# Patient Record
Sex: Female | Born: 1975 | Race: White | Hispanic: No | Marital: Married | Smoking: Never smoker
Health system: Southern US, Community
[De-identification: ages and names within clinical notes are randomized; demographics above are authoritative.]

## PROBLEM LIST (undated history)

## (undated) DIAGNOSIS — Z1239 Encounter for other screening for malignant neoplasm of breast: Secondary | ICD-10-CM

## (undated) DIAGNOSIS — E781 Pure hyperglyceridemia: Secondary | ICD-10-CM

## (undated) DIAGNOSIS — L02219 Cutaneous abscess of trunk, unspecified: Secondary | ICD-10-CM

## (undated) DIAGNOSIS — F411 Generalized anxiety disorder: Secondary | ICD-10-CM

## (undated) DIAGNOSIS — T7840XA Allergy, unspecified, initial encounter: Secondary | ICD-10-CM

## (undated) DIAGNOSIS — Z9889 Other specified postprocedural states: Secondary | ICD-10-CM

## (undated) DIAGNOSIS — F329 Major depressive disorder, single episode, unspecified: Secondary | ICD-10-CM

## (undated) DIAGNOSIS — J302 Other seasonal allergic rhinitis: Secondary | ICD-10-CM

## (undated) DIAGNOSIS — F419 Anxiety disorder, unspecified: Secondary | ICD-10-CM

## (undated) DIAGNOSIS — L03319 Cellulitis of trunk, unspecified: Secondary | ICD-10-CM

## (undated) DIAGNOSIS — L259 Unspecified contact dermatitis, unspecified cause: Secondary | ICD-10-CM

## (undated) DIAGNOSIS — M1990 Unspecified osteoarthritis, unspecified site: Secondary | ICD-10-CM

## (undated) DIAGNOSIS — E785 Hyperlipidemia, unspecified: Secondary | ICD-10-CM

## (undated) DIAGNOSIS — D259 Leiomyoma of uterus, unspecified: Secondary | ICD-10-CM

## (undated) DIAGNOSIS — R112 Nausea with vomiting, unspecified: Secondary | ICD-10-CM

## (undated) DIAGNOSIS — Z124 Encounter for screening for malignant neoplasm of cervix: Secondary | ICD-10-CM

## (undated) DIAGNOSIS — K644 Residual hemorrhoidal skin tags: Secondary | ICD-10-CM

## (undated) HISTORY — DX: Encounter for screening for malignant neoplasm of cervix: Z12.4

## (undated) HISTORY — DX: Unspecified contact dermatitis, unspecified cause: L25.9

## (undated) HISTORY — DX: Encounter for other screening for malignant neoplasm of breast: Z12.39

## (undated) HISTORY — DX: Cutaneous abscess of trunk, unspecified: L02.219

## (undated) HISTORY — DX: Cellulitis of trunk, unspecified: L03.319

## (undated) HISTORY — DX: Unspecified osteoarthritis, unspecified site: M19.90

## (undated) HISTORY — DX: Allergy, unspecified, initial encounter: T78.40XA

## (undated) HISTORY — DX: Hyperlipidemia, unspecified: E78.5

## (undated) HISTORY — PX: HAND SURGERY: SHX662

## (undated) HISTORY — PX: WISDOM TOOTH EXTRACTION: SHX21

---

## 1994-12-14 HISTORY — PX: HAND SURGERY: SHX662

## 2008-12-14 HISTORY — PX: WISDOM TOOTH EXTRACTION: SHX21

## 2010-07-29 ENCOUNTER — Ambulatory Visit: Payer: Self-pay | Admitting: Otolaryngology

## 2010-10-24 LAB — HM PAP SMEAR: HM Pap smear: NORMAL

## 2011-07-30 ENCOUNTER — Encounter: Payer: Self-pay | Admitting: Internal Medicine

## 2011-08-10 ENCOUNTER — Ambulatory Visit: Payer: Self-pay | Admitting: Internal Medicine

## 2011-08-10 ENCOUNTER — Encounter: Payer: Self-pay | Admitting: Internal Medicine

## 2011-08-10 ENCOUNTER — Ambulatory Visit (INDEPENDENT_AMBULATORY_CARE_PROVIDER_SITE_OTHER): Payer: BC Managed Care – PPO | Admitting: Internal Medicine

## 2011-08-10 VITALS — BP 142/88 | HR 89 | Temp 98.5°F | Resp 20 | Ht 66.0 in | Wt 196.0 lb

## 2011-08-10 DIAGNOSIS — E781 Pure hyperglyceridemia: Secondary | ICD-10-CM | POA: Insufficient documentation

## 2011-08-10 DIAGNOSIS — Z820 Family history of epilepsy and other diseases of the nervous system: Secondary | ICD-10-CM

## 2011-08-10 DIAGNOSIS — L709 Acne, unspecified: Secondary | ICD-10-CM

## 2011-08-10 DIAGNOSIS — L708 Other acne: Secondary | ICD-10-CM

## 2011-08-10 DIAGNOSIS — Z0000 Encounter for general adult medical examination without abnormal findings: Secondary | ICD-10-CM

## 2011-08-10 MED ORDER — DOXYCYCLINE HYCLATE 50 MG PO CAPS: 50.0000 mg | ORAL_CAPSULE | Freq: Two times a day (BID) | ORAL | Status: AC

## 2011-08-10 NOTE — Patient Instructions (Addendum)
Follow up with neurology as instructed. Have lipid profile checked with labs this fall. Return to clinic as needed and in 1 year.  Hypertriglyceridemia  Diet for High blood levels of Triglycerides Most fats in food are triglycerides. Triglycerides in your blood are stored as fat in your body. High levels of triglycerides in your blood may put you at a greater risk for heart disease and stroke.  Normal triglyceride levels are less than 150 mg/dL. Borderline high levels are 150-199 mg/dl. High levels are 200 - 499 mg/dL, and very high triglyceride levels are greater than 500 mg/dL. The decision to treat high triglycerides is generally based on the level. For people with borderline or high triglyceride levels, treatment includes weight loss and exercise. Drugs are recommended for people with very high triglyceride levels. Many people who need treatment for high triglyceride levels have metabolic syndrome. This syndrome is a collection of disorders that often include: insulin resistance, high blood pressure, blood clotting problems, high cholesterol and triglycerides. TESTING PROCEDURE FOR TRIGLYCERIDES  You should not eat 4 hours before getting your triglycerides measured. The normal range of triglycerides is between 10 and 250 milligrams per deciliter (mg/dl). Some people may have extreme levels (1000 or above), but your triglyceride level may be too high if it is above 150 mg/dl, depending on what other risk factors you have for heart disease.   People with high blood triglycerides may also have high blood cholesterol levels. If you have high blood cholesterol as well as high blood triglycerides, your risk for heart disease is probably greater than if you only had high triglycerides. High blood cholesterol is one of the main risk factors for heart disease.  CHANGING YOUR DIET  Your weight can affect your blood triglyceride level. If you are more than 20% above your ideal body weight, you may be able to  lower your blood triglycerides by losing weight. Eating less and exercising regularly is the best way to combat this. Fat provides more calories than any other food. The best way to lose weight is to eat less fat. Only 30% of your total calories should come from fat. Less than 7% of your diet should come from saturated fat. A diet low in fat and saturated fat is the same as a diet to decrease blood cholesterol. By eating a diet lower in fat, you may lose weight, lower your blood cholesterol, and lower your blood triglyceride level.  Eating a diet low in fat, especially saturated fat, may also help you lower your blood triglyceride level. Ask your dietitian to help you figure how much fat you can eat based on the number of calories your caregiver has prescribed for you.  Exercise, in addition to helping with weight loss may also help lower triglyceride levels.   Alcohol can increase blood triglycerides. You may need to stop drinking alcoholic beverages.   Too much carbohydrate in your diet may also increase your blood triglycerides. Some complex carbohydrates are necessary in your diet. These may include bread, rice, potatoes, other starchy vegetables and cereals.   Reduce "simple" carbohydrates. These may include pure sugars, candy, honey, and jelly without losing other nutrients. If you have the kind of high blood triglycerides that is affected by the amount of carbohydrates in your diet, you will need to eat less sugar and less high-sugar foods. Your caregiver can help you with this.   Adding 2-4 grams of fish oil (EPA+ DHA) may also help lower triglycerides. Speak with your caregiver before  adding any supplements to your regimen.  Following the Diet  Maintain your ideal weight. Your caregivers can help you with a diet. Generally, eating less food and getting more exercise will help you lose weight. Joining a weight control group may also help. Ask your caregivers for a good weight control group in  your area.  Eat low-fat foods instead of high-fat foods. This can help you lose weight too.  These foods are lower in fat. Eat MORE of these:   Dried beans, peas, and lentils.   Egg whites.   Low-fat cottage cheese.   Fish.   Lean cuts of meat, such as round, sirloin, rump, and flank (cut extra fat off meat you fix).   Whole grain breads, cereals and pasta.   Skim and nonfat dry milk.   Low-fat yogurt.   Poultry without the skin.   Cheese made with skim or part-skim milk, such as mozzarella, parmesan, farmers', ricotta, or pot cheese.   These are higher fat foods. Eat LESS of these:   Whole milk and foods made from whole milk, such as American, blue, cheddar, monterey jack, and swiss cheese   High-fat meats, such as luncheon meats, sausages, knockwurst, bratwurst, hot dogs, ribs, corned beef, ground pork, and regular ground beef.   Fried foods.  Limit saturated fats in your diet. Substituting unsaturated fat for saturated fat may decrease your blood triglyceride level. You will need to read package labels to know which products contain saturated fats.  These foods are high in saturated fat. Eat LESS of these:   Fried pork skins.  Whole milk.   Skin and fat from poultry.   Palm oil.   Butter.   Shortening.   Cream cheese.   Tomasa Blase.   Margarines and baked goods made from listed oils.   Vegetable shortenings.   Chitterlings.  Fat from meats.   Coconut oil.   Palm kernel oil.   Lard.   Cream.   Sour cream.   Fatback.   Coffee whiteners and non-dairy creamers made with these oils.   Cheese made from whole milk.   Use unsaturated fats (both polyunsaturated and monounsaturated) moderately. Remember, even though unsaturated fats are better than saturated fats; you still want a diet low in total fat.  These foods are high in unsaturated fat:   Canola oil.  Sunflower oil.   Mayonnaise.   Almonds.   Peanuts.   Pine nuts.   Margarines made with  these oils.   Safflower oil.  Olive oil.   Avocados.   Cashews.   Peanut butter.   Sunflower seeds.   Soybean oil.  Peanut oil.   Olives.   Pecans.   Walnuts.   Pumpkin seeds.   Avoid sugar and other high-sugar foods. This will decrease carbohydrates without decreasing other nutrients. Sugar in your food goes rapidly to your blood. When there is excess sugar in your blood, your liver may use it to make more triglycerides. Sugar also contains calories without other important nutrients.  Eat LESS of these:   Sugar, brown sugar, powdered sugar, jam, jelly, preserves, honey, syrup, molasses, pies, candy, cakes, cookies, frosting, pastries, colas, soft drinks, punches, fruit drinks, and regular gelatin.   Avoid alcohol. Alcohol, even more than sugar, may increase blood triglycerides. In addition, alcohol is high in calories and low in nutrients. Ask for sparkling water, or a diet soft drink instead of an alcoholic beverage.  Suggestions for planning and preparing meals   Bake, broil, grill or  roast meats instead of frying.   Remove fat from meats and skin from poultry before cooking.   Add spices, herbs, lemon juice or vinegar to vegetables instead of salt, rich sauces or gravies.   Use a non-stick skillet without fat or use no-stick sprays.   Cool and refrigerate stews and broth. Then remove the hardened fat floating on the surface before serving.   Refrigerate meat drippings and skim off fat to make low-fat gravies.   Serve more fish.   Use less butter, margarine and other high-fat spreads on bread or vegetables.   Use skim or reconstituted non-fat dry milk for cooking.   Cook with low-fat cheeses.   Substitute low-fat yogurt or cottage cheese for all or part of the sour cream in recipes for sauces, dips or congealed salads.   Use half yogurt/half mayonnaise in salad recipes.   Substitute evaporated skim milk for cream. Evaporated skim milk or reconstituted non-fat  dry milk can be whipped and substituted for whipped cream in certain recipes.   Choose fresh fruits for dessert instead of high-fat foods such as pies or cakes. Fruits are naturally low in fat.  When Dining Out   Order low-fat appetizers such as fruit or vegetable juice, pasta with vegetables or tomato sauce.   Select clear, rather than cream soups.   Ask that dressings and gravies be served on the side. Then use less of them.   Order foods that are baked, broiled, poached, steamed, stir-fried, or roasted.   Ask for margarine instead of butter, and use only a small amount.   Drink sparkling water, unsweetened tea or coffee, or diet soft drinks instead of alcohol or other sweet beverages.  QUESTIONS AND ANSWERS ABOUT OTHER FATS IN THE BLOOD:  SATURATED FAT, TRANS FAT, AND CHOLESTEROL What is trans fat? Trans fat is a type of fat that is formed when vegetable oil is hardened through a process called hydrogenation. This process helps makes foods more solid, gives them shape, and prolongs their shelf life. Trans fats are also called hydrogenated or partially hydrogenated oils.  What do saturated fat, trans fat, and cholesterol in foods have to do with heart disease? Saturated fat, trans fat, and cholesterol in the diet all raise the level of LDL "bad" cholesterol in the blood. The higher the LDL cholesterol, the greater the risk for coronary heart disease (CHD). Saturated fat and trans fat raise LDL similarly.  What foods contain saturated fat, trans fat, and cholesterol? High amounts of saturated fat are found in animal products, such as fatty cuts of meat, chicken skin, and full-fat dairy products like butter, whole milk, cream, and cheese, and in tropical vegetable oils such as palm, palm kernel, and coconut oil. Trans fat is found in some of the same foods as saturated fat, such as vegetable shortening, some margarines (especially hard or stick margarine), crackers, cookies, baked goods, fried  foods, salad dressings, and other processed foods made with partially hydrogenated vegetable oils. Small amounts of trans fat also occur naturally in some animal products, such as milk products, beef, and lamb. Foods high in cholesterol include liver, other organ meats, egg yolks, shrimp, and full-fat dairy products. How can I use the new food label to make heart-healthy food choices? Check the Nutrition Facts panel of the food label. Choose foods lower in saturated fat, trans fat, and cholesterol. For saturated fat and cholesterol, you can also use the Percent Daily Value (%DV): 5% DV or less is low, and 20%  DV or more is high. (There is no %DV for trans fat.) Use the Nutrition Facts panel to choose foods low in saturated fat and cholesterol, and if the trans fat is not listed, read the ingredients and limit products that list shortening or hydrogenated or partially hydrogenated vegetable oil, which tend to be high in trans fat. POINTS TO REMEMBER: YOU NEED A LITTLE TLC (THERAPEUTIC LIFESTYLE CHANGES)  Discuss your risk for heart disease with your caregivers, and take steps to reduce risk factors.   Change your diet. Choose foods that are low in saturated fat, trans fat, and cholesterol.   Add exercise to your daily routine if it is not already being done. Participate in physical activity of moderate intensity, like brisk walking, for at least 30 minutes on most, and preferably all days of the week. No time? Break the 30 minutes into three, 10-minute segments during the day.   Stop smoking. If you do smoke, contact your caregiver to discuss ways in which they can help you quit.   Do not use street drugs.   Maintain a normal weight.   Maintain a healthy blood pressure.   Keep up with your blood work for checking the fats in your blood as directed by your caregiver.  Document Released: 09/17/2004 Document Re-Released: 05/20/2010 [facility] Patient Information 2011 [place], [place].

## 2011-08-10 NOTE — Progress Notes (Signed)
Subjective:    Patient ID: Alisha Wagner, female    DOB: 05/16/1976, 35 y.o.   MRN: [id number]  HPI  Alisha Wagner is a 35YO female who presents for her annual exam. She denies any complaints. She has been exercising several hours per week doing aerobics and lifting weights in effort to improve overall health. She has also been cutting out processed carbohydrates from her diet and increasing intake of fruits and vegetables.  She notes that her father passed away this past spring, and at the time of his death, revealed that he had myotonic dystrophy type 2.  She is interested in learning more as to her risk of this condition.  Outpatient Encounter Prescriptions as of 08/10/2011  Medication Sig Dispense Refill  . Cholecalciferol 1000 UNITS capsule Take 1,000 Units by mouth daily.        . fish oil-omega-3 fatty acids 1000 MG capsule Take 2 g by mouth daily.        . multivitamin (THERAGRAN) tablet Take 1 tablet by mouth daily.        Marland Kitchen doxycycline (VIBRAMYCIN) 50 MG capsule Take 1 capsule (50 mg total) by mouth 2 (two) times daily.  60 capsule  6     Review of Systems  Constitutional: Negative for fever, chills, diaphoresis, activity change, appetite change, fatigue and unexpected weight change.  HENT: Negative for hearing loss, ear pain, congestion, sore throat, rhinorrhea, sneezing, trouble swallowing, neck pain, neck stiffness, voice change, postnasal drip, sinus pressure and tinnitus.   Eyes: Negative for photophobia, pain, redness and visual disturbance.  Respiratory: Negative for apnea, cough, choking, chest tightness, shortness of breath, wheezing and stridor.   Cardiovascular: Negative for chest pain, palpitations and leg swelling.  Gastrointestinal: Negative for nausea, vomiting, abdominal pain, diarrhea, constipation, blood in stool, abdominal distention, anal bleeding and rectal pain.  Genitourinary: Negative for dysuria, urgency, frequency, flank pain, decreased urine volume, vaginal  bleeding, vaginal discharge, difficulty urinating, vaginal pain, menstrual problem and pelvic pain.  Musculoskeletal: Negative for myalgias, back pain, joint swelling, arthralgias and gait problem.  Skin: Negative for color change, rash and wound.  Neurological: Negative for dizziness, tremors, seizures, syncope, facial asymmetry, speech difficulty, weakness, light-headedness, numbness and headaches.  Hematological: Negative for adenopathy. Does not bruise/bleed easily.  Psychiatric/Behavioral: Negative for suicidal ideas, hallucinations, sleep disturbance, dysphoric mood and decreased concentration. The patient is not nervous/anxious.    BP 142/88  Pulse 89  Temp(Src) 98.5 F (36.9 C) (Oral)  Resp 20  Ht 5\' 6"  (1.676 m)  Wt 196 lb (88.905 kg)  BMI 31.64 kg/m2  SpO2 99%  LMP 07/15/2011     Objective:   Physical Exam  Constitutional: She is oriented to person, place, and time. She appears well-developed and well-nourished. No distress.  HENT:  Head: Normocephalic and atraumatic.  Right Ear: External ear normal.  Left Ear: External ear normal.  Nose: Nose normal.  Mouth/Throat: Oropharynx is clear and moist. No oropharyngeal exudate.  Eyes: Conjunctivae are normal. Pupils are equal, round, and reactive to light. Right eye exhibits no discharge. Left eye exhibits no discharge. No scleral icterus.  Neck: Normal range of motion. Neck supple. No tracheal deviation present. No thyromegaly present.  Cardiovascular: Normal rate, regular rhythm, normal heart sounds and intact distal pulses.  Exam reveals no gallop and no friction rub.   No murmur heard. Pulmonary/Chest: Effort normal and breath sounds normal. No respiratory distress. She has no wheezes. She has no rales. She exhibits no tenderness.  Abdominal: Soft.  Bowel sounds are normal. She exhibits no distension and no mass. There is no tenderness. There is no rebound and no guarding.  Musculoskeletal: Normal range of motion. She  exhibits no edema and no tenderness.  Lymphadenopathy:    She has no cervical adenopathy.  Neurological: She is alert and oriented to person, place, and time. She has normal strength. She displays no tremor and normal reflexes. No cranial nerve deficit. She exhibits normal muscle tone. She displays a negative Romberg sign. Coordination and gait normal.  Reflex Scores:      Patellar reflexes are 2+ on the right side and 2+ on the left side. Skin: Skin is warm and dry. Rash noted. Rash is pustular. She is not diaphoretic. No erythema. No pallor.       Few acne pustules over upper back  Psychiatric: She has a normal mood and affect. Her behavior is normal. Judgment and thought content normal.          Assessment & Plan:  1, General well exam - Breast exam normal today. PAP deferred as pt had normal PAP last year which was HPV negative.  Labwork including lipid profile will be performed in the fall as a free service at pt work.  She will fax to our office.  Encouraged her to continue with diet and exercise. Gave additional handout on improving triglycerides.  She is due for TDAP, but we do not have vaccine available today, so she will RTC next week.  2. Family hx of myotonic dystrophy - Exam normal today. Will set up neurology evaluation and genetic counseling about testing for risk of MD2.  3. Acne - Chronic, stable. Improved in past with doxycycline, so will use again.  Discussed risk of GI upset and sunburn with doxycycline.

## 2011-11-09 ENCOUNTER — Telehealth: Payer: Self-pay | Admitting: Internal Medicine

## 2011-11-09 NOTE — Telephone Encounter (Signed)
Yes please schedule, thanks

## 2011-11-09 NOTE — Telephone Encounter (Signed)
[phone] Pt checking to see if the whooping cough vaccine is in.  She needs to make appointment.  One of her co workers has whopping cough Please call pt

## 2011-11-10 NOTE — Telephone Encounter (Signed)
For dr appointment of just nurse visit

## 2011-11-11 NOTE — Telephone Encounter (Signed)
Nurse visit please

## 2011-11-11 NOTE — Telephone Encounter (Signed)
Called pt.  Appointment 11/12/11 for nurse visit.  Patient aware of appointment

## 2011-11-12 ENCOUNTER — Ambulatory Visit (INDEPENDENT_AMBULATORY_CARE_PROVIDER_SITE_OTHER): Payer: BC Managed Care – PPO | Admitting: *Deleted

## 2011-11-12 DIAGNOSIS — Z23 Encounter for immunization: Secondary | ICD-10-CM

## 2013-06-22 ENCOUNTER — Other Ambulatory Visit: Payer: Self-pay | Admitting: Internal Medicine

## 2013-10-24 ENCOUNTER — Encounter: Payer: Self-pay | Admitting: Internal Medicine

## 2013-10-24 ENCOUNTER — Other Ambulatory Visit (HOSPITAL_COMMUNITY)
Admission: RE | Admit: 2013-10-24 | Discharge: 2013-10-24 | Disposition: A | Discharge status: Home / Self Care | Payer: BC Managed Care – PPO | Source: Ambulatory Visit | Attending: Internal Medicine | Admitting: Internal Medicine

## 2013-10-24 ENCOUNTER — Ambulatory Visit (INDEPENDENT_AMBULATORY_CARE_PROVIDER_SITE_OTHER): Payer: BC Managed Care – PPO | Admitting: Internal Medicine

## 2013-10-24 VITALS — BP 124/88 | HR 80 | Temp 97.6°F | Ht 66.0 in | Wt 202.0 lb

## 2013-10-24 DIAGNOSIS — Z1151 Encounter for screening for human papillomavirus (HPV): Secondary | ICD-10-CM | POA: Insufficient documentation

## 2013-10-24 DIAGNOSIS — Z0000 Encounter for general adult medical examination without abnormal findings: Secondary | ICD-10-CM

## 2013-10-24 DIAGNOSIS — Z01419 Encounter for gynecological examination (general) (routine) without abnormal findings: Secondary | ICD-10-CM | POA: Insufficient documentation

## 2013-10-24 LAB — HM PAP SMEAR: HM PAP: NEGATIVE

## 2013-10-24 NOTE — Progress Notes (Signed)
Subjective:    Patient ID: Alisha Wagner, female    DOB: 14-Jan-1976, 37 y.o.   MRN: [id number]  HPI 37YO female presents for annual exam. Generally feeling well. No concerns today. Follows healthy diet. Running 2-3 times per week and also using local gym. 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 Flu vaccine UTD.   Outpatient Encounter Prescriptions as of 10/24/2013  Medication Sig  . B Complex-Biotin-FA (B-COMPLEX PO) Take by mouth.  . Cholecalciferol 1000 UNITS capsule Take 1,000 Units by mouth daily.    . fish oil-omega-3 fatty acids 1000 MG capsule Take 2 g by mouth daily.    . multivitamin (THERAGRAN) tablet Take 1 tablet by mouth daily.     BP 124/88  Pulse 80  Temp(Src) 97.6 F (36.4 C) (Oral)  Ht 5\' 6"  (1.676 m)  Wt 202 lb (91.627 kg)  BMI 32.62 kg/m2  SpO2 94%  LMP 10/16/2013  Review of Systems  Constitutional: Negative for fever, chills, appetite change, fatigue and unexpected weight change.  HENT: Negative for congestion, ear pain, sinus pressure, sore throat, trouble swallowing and voice change.   Eyes: Negative for visual disturbance.  Respiratory: Negative for cough, shortness of breath, wheezing and stridor.   Cardiovascular: Negative for chest pain, palpitations and leg swelling.  Gastrointestinal: Negative for nausea, vomiting, abdominal pain, diarrhea, constipation, blood in stool, abdominal distention and anal bleeding.  Genitourinary: Negative for dysuria and flank pain.  Musculoskeletal: Negative for arthralgias, gait problem, myalgias and neck pain.  Skin: Negative for color change and rash.  Neurological: Negative for dizziness and headaches.  Hematological: Negative for adenopathy. Does not bruise/bleed easily.  Psychiatric/Behavioral: Negative for suicidal ideas, sleep disturbance and dysphoric mood. The patient is not nervous/anxious.        Objective:   Physical Exam  Constitutional: She is oriented to person, place, and time. She appears  well-developed and well-nourished. No distress.  HENT:  Head: Normocephalic and atraumatic.  Right Ear: External ear normal.  Left Ear: External ear normal.  Nose: Nose normal.  Mouth/Throat: Oropharynx is clear and moist. No oropharyngeal exudate.  Eyes: Conjunctivae are normal. Pupils are equal, round, and reactive to light. Right eye exhibits no discharge. Left eye exhibits no discharge. No scleral icterus.  Neck: Normal range of motion. Neck supple. No tracheal deviation present. No thyromegaly present.  Cardiovascular: Normal rate, regular rhythm, normal heart sounds and intact distal pulses.  Exam reveals no gallop and no friction rub.   No murmur heard. Pulmonary/Chest: Effort normal and breath sounds normal. No accessory muscle usage. Not tachypneic. No respiratory distress. She has no decreased breath sounds. She has no wheezes. She has no rhonchi. She has no rales. She exhibits no tenderness.  Abdominal: Soft. Bowel sounds are normal. She exhibits no distension and no mass. There is no tenderness. There is no rebound and no guarding.  Genitourinary: Rectum normal and uterus normal. No breast swelling, tenderness, discharge or bleeding. Pelvic exam was performed with patient supine. There is no rash, tenderness or lesion on the right labia. There is no rash, tenderness or lesion on the left labia. Uterus is not enlarged and not tender. Cervix exhibits no motion tenderness, no discharge and no friability. Right adnexum displays no mass, no tenderness and no fullness. Left adnexum displays no mass, no tenderness and no fullness. No erythema or tenderness around the vagina. Vaginal discharge (yellow-brown, pt had menses last week) found.  Musculoskeletal: Normal range of motion. She exhibits no edema and no  tenderness.  Lymphadenopathy:    She has no cervical adenopathy.  Neurological: She is alert and oriented to person, place, and time. No cranial nerve deficit. She exhibits normal muscle  tone. Coordination normal.  Skin: Skin is warm and dry. No rash noted. She is not diaphoretic. No erythema. No pallor.  Psychiatric: She has a normal mood and affect. Her behavior is normal. Judgment and thought content normal.          Assessment & Plan:

## 2013-10-24 NOTE — Progress Notes (Signed)
Pre-visit discussion using our clinic review tool. No additional management support is needed unless otherwise documented below in the visit note.  

## 2013-10-24 NOTE — Assessment & Plan Note (Signed)
General medical exam normal today including breast and pelvic exam. PAP pending. Immunizations UTD. Encouraged healthy diet and regular exercise. Fasting labs ordered including CBC, CMP, lipids, TSH, Vit D.

## 2013-10-24 NOTE — Addendum Note (Signed)
Addended by: Montine Circle D on: 10/24/2013 11:22 AM   Modules accepted: Orders

## 2013-10-26 ENCOUNTER — Encounter: Payer: Self-pay | Admitting: *Deleted

## 2013-10-27 ENCOUNTER — Encounter: Payer: Self-pay | Admitting: *Deleted

## 2013-10-27 ENCOUNTER — Other Ambulatory Visit (INDEPENDENT_AMBULATORY_CARE_PROVIDER_SITE_OTHER): Payer: BC Managed Care – PPO

## 2013-10-27 DIAGNOSIS — Z0000 Encounter for general adult medical examination without abnormal findings: Secondary | ICD-10-CM

## 2013-10-27 LAB — CBC WITH DIFFERENTIAL/PLATELET
Basophils Relative: 0.5 % (ref 0.0–3.0)
Eosinophils Relative: 3.4 % (ref 0.0–5.0)
HCT: 43.5 % (ref 36.0–46.0)
Lymphs Abs: 2 10*3/uL (ref 0.7–4.0)
MCHC: 34.7 g/dL (ref 30.0–36.0)
MCV: 93.9 fl (ref 78.0–100.0)
Monocytes Absolute: 0.5 10*3/uL (ref 0.1–1.0)
Platelets: 196 10*3/uL (ref 150.0–400.0)
RBC: 4.63 Mil/uL (ref 3.87–5.11)
WBC: 6.7 10*3/uL (ref 4.5–10.5)

## 2013-10-27 LAB — COMPREHENSIVE METABOLIC PANEL
AST: 23 U/L (ref 0–37)
Alkaline Phosphatase: 26 U/L — ABNORMAL LOW (ref 39–117)
Creatinine, Ser: 0.7 mg/dL (ref 0.4–1.2)
Glucose, Bld: 97 mg/dL (ref 70–99)
Sodium: 134 mEq/L — ABNORMAL LOW (ref 135–145)
Total Bilirubin: 0.6 mg/dL (ref 0.3–1.2)
Total Protein: 6.7 g/dL (ref 6.0–8.3)

## 2013-10-27 LAB — LIPID PANEL
Cholesterol: 166 mg/dL (ref 0–200)
Total CHOL/HDL Ratio: 5
Triglycerides: 299 mg/dL — ABNORMAL HIGH (ref 0.0–149.0)

## 2013-10-27 LAB — LDL CHOLESTEROL, DIRECT: Direct LDL: 88.1 mg/dL

## 2013-10-27 LAB — TSH: TSH: 1 u[IU]/mL (ref 0.35–5.50)

## 2013-11-03 ENCOUNTER — Encounter: Payer: Self-pay | Admitting: *Deleted

## 2014-01-22 ENCOUNTER — Ambulatory Visit: Payer: BC Managed Care – PPO | Admitting: Internal Medicine

## 2014-01-29 ENCOUNTER — Ambulatory Visit (INDEPENDENT_AMBULATORY_CARE_PROVIDER_SITE_OTHER): Payer: BC Managed Care – PPO | Admitting: Internal Medicine

## 2014-01-29 ENCOUNTER — Encounter: Payer: Self-pay | Admitting: Internal Medicine

## 2014-01-29 VITALS — BP 132/90 | HR 85 | Temp 97.5°F | Wt 204.0 lb

## 2014-01-29 DIAGNOSIS — L858 Other specified epidermal thickening: Secondary | ICD-10-CM | POA: Insufficient documentation

## 2014-01-29 DIAGNOSIS — R229 Localized swelling, mass and lump, unspecified: Secondary | ICD-10-CM

## 2014-01-29 DIAGNOSIS — Q828 Other specified congenital malformations of skin: Secondary | ICD-10-CM

## 2014-01-29 HISTORY — DX: Other specified epidermal thickening: L85.8

## 2014-01-29 NOTE — Assessment & Plan Note (Signed)
Exam most consistent with small sebaceous cyst. Discussed option of monitoring versus referral to dermatology for biopsy. Plan to monitor for now. If any changes in size or appearance, pt will call for referral.

## 2014-01-29 NOTE — Progress Notes (Signed)
Pre-visit discussion using our clinic review tool. No additional management support is needed unless otherwise documented below in the visit note.  

## 2014-01-29 NOTE — Assessment & Plan Note (Signed)
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 Discussed use of LacHydrin lotion.

## 2014-01-29 NOTE — Progress Notes (Signed)
   Subjective:    Patient ID: Alisha Wagner, female    DOB: 1976-04-16, 38 y.o.   MRN: [id number]  HPI 38YO female presents for acute visit. Noticed a "bump" on her right upper arm a week or so ago. Unsure how long this has been present. Not painful. No overlying skin changes. No change in appearance since first noticed. No trauma to arm. Otherwise, feeling well.  Review of Systems  Constitutional: Negative for fever, chills and fatigue.  Respiratory: Negative for shortness of breath.   Cardiovascular: Negative for chest pain and leg swelling.  Skin: Negative for color change.  Hematological: Negative for adenopathy.       Objective:    BP 132/90  Pulse 85  Temp(Src) 97.5 F (36.4 C) (Oral)  Wt 204 lb (92.534 kg)  SpO2 97%  LMP 01/18/2014 Physical Exam  Constitutional: She is oriented to person, place, and time. She appears well-developed and well-nourished. No distress.  HENT:  Head: Normocephalic and atraumatic.  Right Ear: External ear normal.  Left Ear: External ear normal.  Nose: Nose normal.  Mouth/Throat: Oropharynx is clear and moist.  Eyes: Conjunctivae are normal. Pupils are equal, round, and reactive to light. Right eye exhibits no discharge. Left eye exhibits no discharge. No scleral icterus.  Neck: Normal range of motion. Neck supple. No tracheal deviation present. No thyromegaly present.  Cardiovascular: Normal rate, regular rhythm, normal heart sounds and intact distal pulses.  Exam reveals no gallop and no friction rub.   No murmur heard. Pulmonary/Chest: Effort normal and breath sounds normal. No accessory muscle usage. Not tachypneic. No respiratory distress. She has no decreased breath sounds. She has no wheezes. She has no rhonchi. She has no rales. She exhibits no tenderness.  Musculoskeletal: Normal range of motion. She exhibits no edema and no tenderness.       Arms: Lymphadenopathy:    She has no cervical adenopathy.  Neurological: She is alert and  oriented to person, place, and time. No cranial nerve deficit. She exhibits normal muscle tone. Coordination normal.  Skin: Skin is warm and dry. No rash noted. She is not diaphoretic. No erythema. No pallor.  Dry papules consistent with hyperkeratosis piloris noted over posterior upper arms bilaterally  Psychiatric: She has a normal mood and affect. Her behavior is normal. Judgment and thought content normal.          Assessment & Plan:   Problem List Items Addressed This Visit   Keratosis pilaris     Exam consistent with hyperkeratosis pilaris posterior upper arms bilaterally. Discussed use of LacHydrin lotion.    Localized skin mass, lump, or swelling - Primary     Exam most consistent with small sebaceous cyst. Discussed option of monitoring versus referral to dermatology for biopsy. Plan to monitor for now. If any changes in size or appearance, pt will call for referral.        Return if symptoms worsen or fail to improve.

## 2014-01-29 NOTE — Patient Instructions (Signed)
Call or email if area on right arm changes or if any new concerns.

## 2014-10-26 ENCOUNTER — Encounter: Payer: BC Managed Care – PPO | Admitting: Internal Medicine

## 2014-12-14 HISTORY — PX: REFRACTIVE SURGERY: SHX103

## 2014-12-21 ENCOUNTER — Encounter: Payer: BC Managed Care – PPO | Admitting: Internal Medicine

## 2015-01-04 ENCOUNTER — Encounter: Payer: Self-pay | Admitting: Internal Medicine

## 2015-02-12 ENCOUNTER — Ambulatory Visit: Payer: Self-pay | Admitting: Internal Medicine

## 2015-02-12 ENCOUNTER — Telehealth: Payer: Self-pay | Admitting: *Deleted

## 2015-02-12 ENCOUNTER — Encounter: Payer: Self-pay | Admitting: Internal Medicine

## 2015-02-12 ENCOUNTER — Ambulatory Visit (INDEPENDENT_AMBULATORY_CARE_PROVIDER_SITE_OTHER): Payer: BLUE CROSS/BLUE SHIELD | Admitting: Internal Medicine

## 2015-02-12 ENCOUNTER — Encounter: Payer: Self-pay | Admitting: *Deleted

## 2015-02-12 VITALS — BP 134/84 | HR 75 | Temp 98.0°F | Ht 66.0 in | Wt 211.2 lb

## 2015-02-12 DIAGNOSIS — R1900 Intra-abdominal and pelvic swelling, mass and lump, unspecified site: Secondary | ICD-10-CM

## 2015-02-12 DIAGNOSIS — R140 Abdominal distension (gaseous): Secondary | ICD-10-CM

## 2015-02-12 DIAGNOSIS — Z0000 Encounter for general adult medical examination without abnormal findings: Secondary | ICD-10-CM

## 2015-02-12 LAB — COMPREHENSIVE METABOLIC PANEL
ALK PHOS: 25 U/L — AB (ref 39–117)
ALT: 18 U/L (ref 0–35)
AST: 21 U/L (ref 0–37)
Albumin: 4.3 g/dL (ref 3.5–5.2)
BUN: 15 mg/dL (ref 6–23)
CALCIUM: 9.4 mg/dL (ref 8.4–10.5)
CHLORIDE: 104 meq/L (ref 96–112)
CO2: 25 mEq/L (ref 19–32)
Creatinine, Ser: 0.79 mg/dL (ref 0.40–1.20)
GFR: 85.82 mL/min (ref >60.00)
Glucose, Bld: 100 mg/dL — ABNORMAL HIGH (ref 70–99)
POTASSIUM: 4.3 meq/L (ref 3.5–5.1)
Sodium: 137 mEq/L (ref 135–145)
Total Bilirubin: 0.7 mg/dL (ref 0.2–1.2)
Total Protein: 7.2 g/dL (ref 6.0–8.3)

## 2015-02-12 LAB — CBC WITH DIFFERENTIAL/PLATELET
BASOS ABS: 0 10*3/uL (ref 0.0–0.1)
Basophils Relative: 0.6 % (ref 0.0–3.0)
Eosinophils Absolute: 0.3 10*3/uL (ref 0.0–0.7)
Eosinophils Relative: 3.9 % (ref 0.0–5.0)
HCT: 44.6 % (ref 36.0–46.0)
Hemoglobin: 15.5 g/dL — ABNORMAL HIGH (ref 12.0–15.0)
Lymphocytes Relative: 28 % (ref 12.0–46.0)
Lymphs Abs: 2.1 10*3/uL (ref 0.7–4.0)
MCHC: 34.7 g/dL (ref 30.0–36.0)
MCV: 92.8 fl (ref 78.0–100.0)
MONOS PCT: 8.6 % (ref 3.0–12.0)
Monocytes Absolute: 0.6 10*3/uL (ref 0.1–1.0)
NEUTROS ABS: 4.5 10*3/uL (ref 1.4–7.7)
Neutrophils Relative %: 58.9 % (ref 43.0–77.0)
Platelets: 203 10*3/uL (ref 150.0–400.0)
RBC: 4.8 Mil/uL (ref 3.87–5.11)
RDW: 13.3 % (ref 11.5–15.5)
WBC: 7.6 10*3/uL (ref 4.0–10.5)

## 2015-02-12 LAB — LDL CHOLESTEROL, DIRECT: Direct LDL: 98 mg/dL

## 2015-02-12 LAB — TSH: TSH: 1.12 u[IU]/mL (ref 0.35–4.50)

## 2015-02-12 LAB — LIPID PANEL
Cholesterol: 192 mg/dL (ref 0–200)
HDL: 32.5 mg/dL — AB (ref >39.00)
NONHDL: 159.5
TRIGLYCERIDES: 244 mg/dL — AB (ref 0.0–149.0)
Total CHOL/HDL Ratio: 6
VLDL: 48.8 mg/dL — ABNORMAL HIGH (ref 0.0–40.0)

## 2015-02-12 LAB — VITAMIN D 25 HYDROXY (VIT D DEFICIENCY, FRACTURES): VITD: 27.94 ng/mL — ABNORMAL LOW (ref 30.00–100.00)

## 2015-02-12 LAB — HEMOGLOBIN A1C: Hgb A1c MFr Bld: 5.2 % (ref 4.6–6.5)

## 2015-02-12 NOTE — Progress Notes (Addendum)
Subjective:    Patient ID: Alisha Wagner, female    DOB: [birth year], 39 y.o.   MRN: [id number]  HPI  39YO female presents for annual exam.  Generally feeling well. No concerns today. Recently had Lasik surgery. Did very well with this. Trying to follow healthy diet and exercise regularly.  Notes some right sided abdominal distension. Present for many months-years. Was not concerning to her. Felt this was spasm of abdominal muscles. Not painful. Denies change in bowel habits. Has regular daily BM. No change in urinary habits. No nausea.  Past medical, surgical, family and social history per today's encounter.  Review of Systems  Constitutional: Negative for fever, chills, appetite change, fatigue and unexpected weight change.  Eyes: Negative for visual disturbance.  Respiratory: Negative for shortness of breath.   Cardiovascular: Negative for chest pain and leg swelling.  Gastrointestinal: Positive for abdominal distention. Negative for nausea, vomiting, abdominal pain, diarrhea, constipation, blood in stool and anal bleeding.  Skin: Negative for color change and rash.  Hematological: Negative for adenopathy. Does not bruise/bleed easily.  Psychiatric/Behavioral: Negative for dysphoric mood. The patient is not nervous/anxious.        Objective:    BP 134/84 mmHg  Pulse 75  Temp(Src) 98 F (36.7 C) (Oral)  Ht 5\' 6"  (1.676 m)  Wt 211 lb 4 oz (95.822 kg)  BMI 34.11 kg/m2  SpO2 96%  LMP 02/10/2015 Physical Exam  Constitutional: She is oriented to person, place, and time. She appears well-developed and well-nourished. No distress.  HENT:  Head: Normocephalic and atraumatic.  Right Ear: External ear normal.  Left Ear: External ear normal.  Nose: Nose normal.  Mouth/Throat: Oropharynx is clear and moist. No oropharyngeal exudate.  Eyes: Conjunctivae are normal. Pupils are equal, round, and reactive to light. Right eye exhibits no discharge. Left eye exhibits no discharge. No  scleral icterus.  Neck: Normal range of motion. Neck supple. No tracheal deviation present. No thyromegaly present.  Cardiovascular: Normal rate, regular rhythm, normal heart sounds and intact distal pulses.  Exam reveals no gallop and no friction rub.   No murmur heard. Pulmonary/Chest: Effort normal and breath sounds normal. No accessory muscle usage. No tachypnea. No respiratory distress. She has no decreased breath sounds. She has no wheezes. She has no rales. She exhibits no tenderness. Right breast exhibits no inverted nipple, no mass, no nipple discharge, no skin change and no tenderness. Left breast exhibits no inverted nipple, no mass, no nipple discharge, no skin change and no tenderness. Breasts are symmetrical.  Abdominal: Soft. Bowel sounds are normal. She exhibits distension. She exhibits no mass. There is no tenderness. There is no rebound and no guarding.    Musculoskeletal: Normal range of motion. She exhibits no edema or tenderness.  Lymphadenopathy:    She has no cervical adenopathy.  Neurological: She is alert and oriented to person, place, and time. No cranial nerve deficit. She exhibits normal muscle tone. Coordination normal.  Skin: Skin is warm and dry. No rash noted. She is not diaphoretic. No erythema. No pallor.  Psychiatric: She has a normal mood and affect. Her behavior is normal. Judgment and thought content normal.          Assessment & Plan:   Problem List Items Addressed This Visit      Unprioritized   Abdominal distention    Right sided abdominal distension. Reportedly present for several months. Difficult to tell if there may be underlying mass, however I am concerned about this. No  GI complaints. Will get abdominal US for further evaluation.      Relevant Orders   US Abdomen Limited RUQ   Abdominal mass    US showed large right sided abdominal mass, measuring 19cm in largest dimension. Pt brought back to office to review and discuss findings. Will  set up CT abdomen and pelvis tomorrow. Will also set up surgical referral for possible biopsy/resection.         Relevant Orders   Ambulatory referral to General Surgery   CT Abdomen Pelvis W Contrast   Routine general medical examination at a health care facility - Primary    General medical exam normal today except as noted. Recent PAP 2014 was normal, HPV neg, plan repeat in 2019. Mammogram ordered. Labs today including CBC, CMP, lipids, A1c, TSH. Immunizations are UTD.      Relevant Orders   CBC with Differential/Platelet   Comprehensive metabolic panel   Lipid panel   Vit D  25 hydroxy (rtn osteoporosis monitoring)   TSH   Hemoglobin A1c   MM Digital Screening       Return in about 1 year (around 02/12/2016) for Physical.

## 2015-02-12 NOTE — Addendum Note (Signed)
Addended by: Ronette Deter A on: 02/12/2015 11:51 AM   Modules accepted: Orders, SmartSet

## 2015-02-12 NOTE — Telephone Encounter (Signed)
Alisha Wagner from [place] called to report the liver gallbladder and biliary system are within normal limits.  There is a indeterminate mid abdominal mass measuring 19.4 x 13.5 x 16.6.  Further eval with abd/pelvis CT is recommended.

## 2015-02-12 NOTE — Assessment & Plan Note (Signed)
General medical exam normal today except as noted. Recent PAP 2014 was normal, HPV neg, plan repeat in 2019. Mammogram ordered. Labs today including CBC, CMP, lipids, A1c, TSH. Immunizations are UTD.

## 2015-02-12 NOTE — Assessment & Plan Note (Addendum)
US showed large right sided abdominal mass, measuring 19cm in largest dimension. Pt brought back to office to review and discuss findings. Will set up CT abdomen and pelvis tomorrow. Will also set up surgical referral for possible biopsy/resection.

## 2015-02-12 NOTE — Addendum Note (Signed)
Addended by: Ronette Deter A on: 02/12/2015 12:33 PM   Modules accepted: Level of Service, SmartSet

## 2015-02-12 NOTE — Addendum Note (Signed)
Addended by: Ronette Deter A on: 02/12/2015 01:23 PM   Modules accepted: Miquel Dunn

## 2015-02-12 NOTE — Assessment & Plan Note (Signed)
Right sided abdominal distension. Reportedly present for several months. Difficult to tell if there may be underlying mass, however I am concerned about this. No GI complaints. Will get abdominal US for further evaluation.

## 2015-02-12 NOTE — Progress Notes (Signed)
Pre visit review using our clinic review tool, if applicable. No additional management support is needed unless otherwise documented below in the visit note. 

## 2015-02-12 NOTE — Patient Instructions (Signed)

## 2015-02-13 ENCOUNTER — Ambulatory Visit: Payer: Self-pay | Admitting: Internal Medicine

## 2015-02-14 ENCOUNTER — Other Ambulatory Visit: Payer: Self-pay | Admitting: Internal Medicine

## 2015-02-14 DIAGNOSIS — D259 Leiomyoma of uterus, unspecified: Secondary | ICD-10-CM

## 2015-02-21 ENCOUNTER — Ambulatory Visit: Payer: Self-pay | Admitting: General Surgery

## 2015-04-01 ENCOUNTER — Encounter: Payer: Self-pay | Admitting: Internal Medicine

## 2015-04-02 ENCOUNTER — Encounter: Payer: Self-pay | Admitting: Internal Medicine

## 2015-04-15 ENCOUNTER — Other Ambulatory Visit: Payer: Self-pay | Admitting: Obstetrics and Gynecology

## 2015-04-25 ENCOUNTER — Other Ambulatory Visit (HOSPITAL_COMMUNITY): Payer: BLUE CROSS/BLUE SHIELD

## 2015-04-29 NOTE — Patient Instructions (Addendum)
   Your procedure is scheduled on: Thursday, May 19  Enter through the Main Entrance of [facility] at: 6 AM Pick up the phone at the desk and dial [phone] and inform us of your arrival.  Please call this number if you have any problems the morning of surgery: [phone]  Remember: Do not eat or drink after midnight: Wednesday Take these medicines the morning of surgery with a SIP OF WATER:  Zyrtec if needed.  Do not wear jewelry, make-up, or FINGER nail polish No metal in your hair or on your body. Do not wear lotions, powders, perfumes.  You may wear deodorant.  Do not bring valuables to the hospital. Contacts, dentures or bridgework may not be worn into surgery.  Leave suitcase in the car. After Surgery it may be brought to your room. For patients being admitted to the hospital, checkout time is 11:00am the day of discharge.  Home with partner April cell [phone]

## 2015-04-30 ENCOUNTER — Encounter (HOSPITAL_COMMUNITY)
Admission: RE | Admit: 2015-04-30 | Discharge: 2015-04-30 | Disposition: A | Discharge status: Home / Self Care | Payer: BLUE CROSS/BLUE SHIELD | Source: Ambulatory Visit | Attending: Obstetrics and Gynecology | Admitting: Obstetrics and Gynecology

## 2015-04-30 ENCOUNTER — Encounter (HOSPITAL_COMMUNITY): Payer: Self-pay

## 2015-04-30 HISTORY — DX: Anxiety disorder, unspecified: F41.9

## 2015-04-30 HISTORY — DX: Other specified postprocedural states: Z98.890

## 2015-04-30 HISTORY — DX: Nausea with vomiting, unspecified: R11.2

## 2015-04-30 HISTORY — DX: Other seasonal allergic rhinitis: J30.2

## 2015-04-30 HISTORY — DX: Major depressive disorder, single episode, unspecified: F32.9

## 2015-04-30 LAB — ABO/RH: ABO/RH(D): O POS

## 2015-04-30 LAB — TYPE AND SCREEN
ABO/RH(D): O POS
Antibody Screen: NEGATIVE

## 2015-04-30 LAB — CBC
HCT: 42.4 % (ref 36.0–46.0)
Hemoglobin: 15.2 g/dL — ABNORMAL HIGH (ref 12.0–15.0)
MCH: 32.9 pg (ref 26.0–34.0)
MCHC: 35.8 g/dL (ref 30.0–36.0)
MCV: 91.8 fL (ref 78.0–100.0)
PLATELETS: 191 10*3/uL (ref 150–400)
RBC: 4.62 MIL/uL (ref 3.87–5.11)
RDW: 13 % (ref 11.5–15.5)
WBC: 7.4 10*3/uL (ref 4.0–10.5)

## 2015-05-01 ENCOUNTER — Encounter (HOSPITAL_COMMUNITY): Payer: Self-pay | Admitting: Anesthesiology

## 2015-05-01 NOTE — Anesthesia Preprocedure Evaluation (Addendum)
Anesthesia Evaluation  Patient identified by MRN, date of birth, ID band Patient awake    Reviewed: Allergy & Precautions, NPO status , Patient's Chart, lab work & pertinent test results  History of Anesthesia Complications (+) PONV and history of anesthetic complications  Airway Mallampati: II  TM Distance: >3 FB Neck ROM: Full    Dental no notable dental hx. (+) Teeth Intact   Pulmonary neg pulmonary ROS,  breath sounds clear to auscultation  Pulmonary exam normal       Cardiovascular negative cardio ROS Normal cardiovascular examRhythm:Regular Rate:Normal     Neuro/Psych PSYCHIATRIC DISORDERS Anxiety Depression negative neurological ROS     GI/Hepatic negative GI ROS, Neg liver ROS,   Endo/Other  Hypertriglyceridemia Obesity  Renal/GU negative Renal ROS     Musculoskeletal negative musculoskeletal ROS (+)   Abdominal (+) + obese,   Peds  Hematology negative hematology ROS (+)   Anesthesia Other Findings   Reproductive/Obstetrics Enlarged uterine fibroid Bilateral hydrosalphinx                          Anesthesia Physical Anesthesia Plan  ASA: II  Anesthesia Plan: General   Post-op Pain Management:    Induction: Intravenous  Airway Management Planned: Oral ETT  Additional Equipment:   Intra-op Plan:   Post-operative Plan: Extubation in OR  Informed Consent: I have reviewed the patients History and Physical, chart, labs and discussed the procedure including the risks, benefits and alternatives for the proposed anesthesia with the patient or authorized representative who has indicated his/her understanding and acceptance.   Dental advisory given  Plan Discussed with: CRNA, Anesthesiologist and Surgeon  Anesthesia Plan Comments:         Anesthesia Quick Evaluation

## 2015-05-02 ENCOUNTER — Inpatient Hospital Stay (HOSPITAL_COMMUNITY): Payer: BLUE CROSS/BLUE SHIELD | Admitting: Anesthesiology

## 2015-05-02 ENCOUNTER — Encounter (HOSPITAL_COMMUNITY)
Admission: RE | Disposition: A | Discharge status: Home / Self Care | Payer: Self-pay | Source: Ambulatory Visit | Attending: Obstetrics and Gynecology

## 2015-05-02 ENCOUNTER — Encounter (HOSPITAL_COMMUNITY): Payer: Self-pay | Admitting: *Deleted

## 2015-05-02 ENCOUNTER — Inpatient Hospital Stay (HOSPITAL_COMMUNITY)
Admission: RE | Admit: 2015-05-02 | Discharge: 2015-05-03 | DRG: 743 | Disposition: A | Discharge status: Home / Self Care | Payer: BLUE CROSS/BLUE SHIELD | Source: Ambulatory Visit | Attending: Obstetrics and Gynecology | Admitting: Obstetrics and Gynecology

## 2015-05-02 DIAGNOSIS — D259 Leiomyoma of uterus, unspecified: Secondary | ICD-10-CM | POA: Diagnosis present

## 2015-05-02 DIAGNOSIS — Z79899 Other long term (current) drug therapy: Secondary | ICD-10-CM | POA: Diagnosis not present

## 2015-05-02 DIAGNOSIS — N7011 Chronic salpingitis: Secondary | ICD-10-CM | POA: Diagnosis present

## 2015-05-02 DIAGNOSIS — Z9889 Other specified postprocedural states: Secondary | ICD-10-CM

## 2015-05-02 HISTORY — PX: MYOMECTOMY: SHX85

## 2015-05-02 SURGERY — MYOMECTOMY, ABDOMINAL APPROACH
Anesthesia: General | Site: Abdomen

## 2015-05-02 MED ORDER — SODIUM CHLORIDE 0.9 % IJ SOLN: 9.0000 mL | INTRAMUSCULAR | Status: DC | PRN

## 2015-05-02 MED ORDER — IBUPROFEN 800 MG PO TABS: 800.0000 mg | ORAL_TABLET | Freq: Three times a day (TID) | ORAL | Status: DC | PRN

## 2015-05-02 MED ORDER — NALOXONE HCL 0.4 MG/ML IJ SOLN: 0.4000 mg | INTRAMUSCULAR | Status: DC | PRN

## 2015-05-02 MED ORDER — BUPIVACAINE HCL (PF) 0.25 % IJ SOLN
INTRAMUSCULAR | Status: DC | PRN
  Administered 2015-05-02: 8 mL

## 2015-05-02 MED ORDER — HYDROMORPHONE HCL 1 MG/ML IJ SOLN
INTRAMUSCULAR | Status: DC | PRN
  Administered 2015-05-02: 1 mg via INTRAVENOUS

## 2015-05-02 MED ORDER — ONDANSETRON HCL 4 MG/2ML IJ SOLN
INTRAMUSCULAR | Status: AC
  Filled 2015-05-02: qty 2

## 2015-05-02 MED ORDER — SIMETHICONE 80 MG PO CHEW: 80.0000 mg | CHEWABLE_TABLET | Freq: Four times a day (QID) | ORAL | Status: DC | PRN

## 2015-05-02 MED ORDER — GLYCOPYRROLATE 0.2 MG/ML IJ SOLN
INTRAMUSCULAR | Status: AC
  Filled 2015-05-02: qty 2

## 2015-05-02 MED ORDER — MENTHOL 3 MG MT LOZG: 1.0000 | LOZENGE | OROMUCOSAL | Status: DC | PRN

## 2015-05-02 MED ORDER — PHENYLEPHRINE 40 MCG/ML (10ML) SYRINGE FOR IV PUSH (FOR BLOOD PRESSURE SUPPORT)
PREFILLED_SYRINGE | INTRAVENOUS | Status: AC
  Filled 2015-05-02: qty 10

## 2015-05-02 MED ORDER — ONDANSETRON HCL 4 MG/2ML IJ SOLN
INTRAMUSCULAR | Status: DC | PRN
  Administered 2015-05-02: 4 mg via INTRAVENOUS

## 2015-05-02 MED ORDER — FENTANYL CITRATE (PF) 100 MCG/2ML IJ SOLN
INTRAMUSCULAR | Status: DC | PRN
  Administered 2015-05-02: 50 ug via INTRAVENOUS
  Administered 2015-05-02: 150 ug via INTRAVENOUS
  Administered 2015-05-02: 50 ug via INTRAVENOUS

## 2015-05-02 MED ORDER — ROCURONIUM BROMIDE 100 MG/10ML IV SOLN
INTRAVENOUS | Status: AC
  Filled 2015-05-02: qty 1

## 2015-05-02 MED ORDER — VASOPRESSIN 20 UNIT/ML IV SOLN
INTRAVENOUS | Status: AC
  Filled 2015-05-02: qty 2

## 2015-05-02 MED ORDER — METOCLOPRAMIDE HCL 5 MG/ML IJ SOLN
10.0000 mg | Freq: Once | INTRAMUSCULAR | Status: AC | PRN
  Administered 2015-05-02: 10 mg via INTRAVENOUS

## 2015-05-02 MED ORDER — CEFAZOLIN SODIUM-DEXTROSE 2-3 GM-% IV SOLR
INTRAVENOUS | Status: AC
  Filled 2015-05-02: qty 50

## 2015-05-02 MED ORDER — SODIUM CHLORIDE 0.9 % IJ SOLN
INTRAMUSCULAR | Status: AC
Start: 2015-05-02 — End: 2015-05-02
  Filled 2015-05-02: qty 100

## 2015-05-02 MED ORDER — MIDAZOLAM HCL 2 MG/2ML IJ SOLN
INTRAMUSCULAR | Status: DC | PRN
  Administered 2015-05-02 (×2): 1 mg via INTRAVENOUS

## 2015-05-02 MED ORDER — DEXAMETHASONE SODIUM PHOSPHATE 4 MG/ML IJ SOLN
INTRAMUSCULAR | Status: AC
  Filled 2015-05-02: qty 1

## 2015-05-02 MED ORDER — METOCLOPRAMIDE HCL 5 MG/ML IJ SOLN
INTRAMUSCULAR | Status: AC
  Filled 2015-05-02: qty 2

## 2015-05-02 MED ORDER — DEXAMETHASONE SODIUM PHOSPHATE 10 MG/ML IJ SOLN
INTRAMUSCULAR | Status: DC | PRN
  Administered 2015-05-02: 4 mg via INTRAVENOUS

## 2015-05-02 MED ORDER — PROPOFOL 10 MG/ML IV BOLUS
INTRAVENOUS | Status: AC
  Filled 2015-05-02: qty 20

## 2015-05-02 MED ORDER — VASOPRESSIN 20 UNIT/ML IV SOLN
INTRAVENOUS | Status: DC | PRN
  Administered 2015-05-02: 86 mL via INTRAMUSCULAR

## 2015-05-02 MED ORDER — MEPERIDINE HCL 25 MG/ML IJ SOLN: 6.2500 mg | INTRAMUSCULAR | Status: DC | PRN

## 2015-05-02 MED ORDER — PROPOFOL 10 MG/ML IV BOLUS
INTRAVENOUS | Status: DC | PRN
  Administered 2015-05-02: 200 mg via INTRAVENOUS

## 2015-05-02 MED ORDER — HYDROMORPHONE HCL 1 MG/ML IJ SOLN
INTRAMUSCULAR | Status: AC
  Filled 2015-05-02: qty 1

## 2015-05-02 MED ORDER — BUPIVACAINE HCL (PF) 0.25 % IJ SOLN
INTRAMUSCULAR | Status: AC
  Filled 2015-05-02: qty 30

## 2015-05-02 MED ORDER — NEOSTIGMINE METHYLSULFATE 10 MG/10ML IV SOLN
INTRAVENOUS | Status: AC
  Filled 2015-05-02: qty 1

## 2015-05-02 MED ORDER — PANTOPRAZOLE SODIUM 40 MG PO TBEC
40.0000 mg | DELAYED_RELEASE_TABLET | Freq: Every day | ORAL | Status: DC
  Administered 2015-05-03: 40 mg via ORAL
  Filled 2015-05-02: qty 1

## 2015-05-02 MED ORDER — GLYCOPYRROLATE 0.2 MG/ML IJ SOLN
INTRAMUSCULAR | Status: AC
  Filled 2015-05-02: qty 3

## 2015-05-02 MED ORDER — GLYCOPYRROLATE 0.2 MG/ML IJ SOLN
INTRAMUSCULAR | Status: DC | PRN
  Administered 2015-05-02: .4 mg via INTRAVENOUS

## 2015-05-02 MED ORDER — SCOPOLAMINE 1 MG/3DAYS TD PT72
MEDICATED_PATCH | TRANSDERMAL | Status: AC
  Filled 2015-05-02: qty 1

## 2015-05-02 MED ORDER — HYDROMORPHONE HCL 1 MG/ML IJ SOLN
0.2500 mg | INTRAMUSCULAR | Status: DC | PRN
  Administered 2015-05-02: 0.5 mg via INTRAVENOUS

## 2015-05-02 MED ORDER — HYDROMORPHONE 0.3 MG/ML IV SOLN
INTRAVENOUS | Status: DC
  Administered 2015-05-02: 0.3 mg via INTRAVENOUS
  Administered 2015-05-02 – 2015-05-03 (×3): 0.2 mg via INTRAVENOUS
  Filled 2015-05-02: qty 25

## 2015-05-02 MED ORDER — PHENYLEPHRINE HCL 10 MG/ML IJ SOLN
INTRAMUSCULAR | Status: DC | PRN
  Administered 2015-05-02: .08 mg via INTRAVENOUS
  Administered 2015-05-02 (×2): .04 mg via INTRAVENOUS

## 2015-05-02 MED ORDER — KETOROLAC TROMETHAMINE 30 MG/ML IJ SOLN: 30.0000 mg | Freq: Four times a day (QID) | INTRAMUSCULAR | Status: DC

## 2015-05-02 MED ORDER — DIPHENHYDRAMINE HCL 50 MG/ML IJ SOLN: 12.5000 mg | Freq: Four times a day (QID) | INTRAMUSCULAR | Status: DC | PRN

## 2015-05-02 MED ORDER — ONDANSETRON HCL 4 MG/2ML IJ SOLN
4.0000 mg | Freq: Four times a day (QID) | INTRAMUSCULAR | Status: DC | PRN
  Filled 2015-05-02: qty 2

## 2015-05-02 MED ORDER — ONDANSETRON HCL 4 MG/2ML IJ SOLN
4.0000 mg | Freq: Four times a day (QID) | INTRAMUSCULAR | Status: DC | PRN
  Administered 2015-05-02: 4 mg via INTRAVENOUS

## 2015-05-02 MED ORDER — SODIUM CHLORIDE 0.9 % IJ SOLN
INTRAMUSCULAR | Status: AC
  Filled 2015-05-02: qty 50

## 2015-05-02 MED ORDER — CEFAZOLIN SODIUM-DEXTROSE 2-3 GM-% IV SOLR
2.0000 g | INTRAVENOUS | Status: AC
  Administered 2015-05-02: 2 g via INTRAVENOUS

## 2015-05-02 MED ORDER — DEXTROSE IN LACTATED RINGERS 5 % IV SOLN
INTRAVENOUS | Status: DC
  Administered 2015-05-02 (×2): via INTRAVENOUS

## 2015-05-02 MED ORDER — VASOPRESSIN 20 UNIT/ML IV SOLN
INTRAVENOUS | Status: AC
  Filled 2015-05-02: qty 1

## 2015-05-02 MED ORDER — LIDOCAINE HCL (CARDIAC) 20 MG/ML IV SOLN
INTRAVENOUS | Status: DC | PRN
  Administered 2015-05-02: 100 mg via INTRAVENOUS

## 2015-05-02 MED ORDER — OXYCODONE-ACETAMINOPHEN 5-325 MG PO TABS
1.0000 | ORAL_TABLET | ORAL | Status: DC | PRN
  Administered 2015-05-03: 1 via ORAL
  Filled 2015-05-02: qty 1

## 2015-05-02 MED ORDER — HYDROMORPHONE HCL 1 MG/ML IJ SOLN: 0.2000 mg | INTRAMUSCULAR | Status: DC | PRN

## 2015-05-02 MED ORDER — DIPHENHYDRAMINE HCL 12.5 MG/5ML PO ELIX: 12.5000 mg | ORAL_SOLUTION | Freq: Four times a day (QID) | ORAL | Status: DC | PRN

## 2015-05-02 MED ORDER — LIDOCAINE HCL (CARDIAC) 20 MG/ML IV SOLN
INTRAVENOUS | Status: AC
  Filled 2015-05-02: qty 5

## 2015-05-02 MED ORDER — LACTATED RINGERS IV SOLN
INTRAVENOUS | Status: DC
  Administered 2015-05-02 (×3): via INTRAVENOUS

## 2015-05-02 MED ORDER — EPHEDRINE 5 MG/ML INJ
INTRAVENOUS | Status: AC
  Filled 2015-05-02: qty 10

## 2015-05-02 MED ORDER — KETOROLAC TROMETHAMINE 30 MG/ML IJ SOLN
30.0000 mg | Freq: Four times a day (QID) | INTRAMUSCULAR | Status: DC
  Administered 2015-05-02 – 2015-05-03 (×6): 30 mg via INTRAVENOUS
  Filled 2015-05-02 (×6): qty 1

## 2015-05-02 MED ORDER — FENTANYL CITRATE (PF) 250 MCG/5ML IJ SOLN
INTRAMUSCULAR | Status: AC
  Filled 2015-05-02: qty 5

## 2015-05-02 MED ORDER — NEOSTIGMINE METHYLSULFATE 10 MG/10ML IV SOLN
INTRAVENOUS | Status: DC | PRN
  Administered 2015-05-02: 3 mg via INTRAVENOUS

## 2015-05-02 MED ORDER — ROCURONIUM BROMIDE 100 MG/10ML IV SOLN
INTRAVENOUS | Status: DC | PRN
  Administered 2015-05-02: 10 mg via INTRAVENOUS
  Administered 2015-05-02: 4 mg via INTRAVENOUS
  Administered 2015-05-02 (×3): 10 mg via INTRAVENOUS
  Administered 2015-05-02: 5 mg via INTRAVENOUS
  Administered 2015-05-02: 50 mg via INTRAVENOUS

## 2015-05-02 MED ORDER — MIDAZOLAM HCL 2 MG/2ML IJ SOLN
INTRAMUSCULAR | Status: AC
  Filled 2015-05-02: qty 2

## 2015-05-02 MED ORDER — SCOPOLAMINE 1 MG/3DAYS TD PT72
1.0000 | MEDICATED_PATCH | Freq: Once | TRANSDERMAL | Status: DC
  Administered 2015-05-02: 1.5 mg via TRANSDERMAL

## 2015-05-02 MED ORDER — ONDANSETRON HCL 4 MG PO TABS: 4.0000 mg | ORAL_TABLET | Freq: Four times a day (QID) | ORAL | Status: DC | PRN

## 2015-05-02 MED ORDER — EPHEDRINE SULFATE 50 MG/ML IJ SOLN
INTRAMUSCULAR | Status: DC | PRN
  Administered 2015-05-02: 5 mg via INTRAVENOUS
  Administered 2015-05-02: 10 mg via INTRAVENOUS
  Administered 2015-05-02: 5 mg via INTRAVENOUS

## 2015-05-02 SURGICAL SUPPLY — 51 items
BARRIER ADHS 3X4 INTERCEED (GAUZE/BANDAGES/DRESSINGS) ×4 IMPLANT
BRR ADH 4X3 ABS CNTRL BYND (GAUZE/BANDAGES/DRESSINGS) ×3
CANISTER SUCT 3000ML (MISCELLANEOUS) ×2 IMPLANT
CATH FOLEY 2WAY  3CC  8FR (CATHETERS)
CATH FOLEY 2WAY 3CC 8FR (CATHETERS) IMPLANT
CLOTH BEACON ORANGE TIMEOUT ST (SAFETY) ×2 IMPLANT
CONT PATH 16OZ SNAP LID 3702 (MISCELLANEOUS) ×2 IMPLANT
CONT SPEC PATH 64OZ SNAP LID (MISCELLANEOUS) ×2 IMPLANT
DECANTER SPIKE VIAL GLASS SM (MISCELLANEOUS) ×2 IMPLANT
DRAPE CESAREAN BIRTH W POUCH (DRAPES) ×2 IMPLANT
DRSG OPSITE POSTOP 4X10 (GAUZE/BANDAGES/DRESSINGS) ×2 IMPLANT
DURAPREP 26ML APPLICATOR (WOUND CARE) ×2 IMPLANT
ELECT NDL TIP 2.8 STRL (NEEDLE) ×1 IMPLANT
ELECT NEEDLE TIP 2.8 STRL (NEEDLE) ×2 IMPLANT
FILTER STRAW FLUID ASPIR (MISCELLANEOUS) IMPLANT
GAUZE SPONGE 4X4 16PLY XRAY LF (GAUZE/BANDAGES/DRESSINGS) ×2 IMPLANT
GLOVE BIOGEL PI IND STRL 7.0 (GLOVE) ×1 IMPLANT
GLOVE BIOGEL PI INDICATOR 7.0 (GLOVE) ×1
GLOVE ECLIPSE 6.5 STRL STRAW (GLOVE) ×2 IMPLANT
GOWN STRL REUS W/TWL LRG LVL3 (GOWN DISPOSABLE) ×6 IMPLANT
IV STOPCOCK 4 WAY 40  W/Y SET (IV SOLUTION)
IV STOPCOCK 4 WAY 40 W/Y SET (IV SOLUTION) IMPLANT
NDL SPNL 22GX3.5 QUINCKE BK (NEEDLE) IMPLANT
NEEDLE HYPO 22GX1.5 SAFETY (NEEDLE) ×2 IMPLANT
NEEDLE SPNL 22GX3.5 QUINCKE BK (NEEDLE) IMPLANT
NS IRRIG 1000ML POUR BTL (IV SOLUTION) ×2 IMPLANT
PACK ABDOMINAL GYN (CUSTOM PROCEDURE TRAY) ×2 IMPLANT
PAD OB MATERNITY 4.3X12.25 (PERSONAL CARE ITEMS) ×2 IMPLANT
RETRACTOR WND ALEXIS 25 LRG (MISCELLANEOUS) IMPLANT
RTRCTR WOUND ALEXIS 25CM LRG (MISCELLANEOUS)
SPONGE GAUZE 4X4 12PLY STER LF (GAUZE/BANDAGES/DRESSINGS) ×2 IMPLANT
SPONGE LAP 18X18 X RAY DECT (DISPOSABLE) ×6 IMPLANT
STAPLER VISISTAT 35W (STAPLE) IMPLANT
SUT ETHILON 5 0 P 3 18 (SUTURE)
SUT MNCRL AB 4-0 PS2 18 (SUTURE) ×4 IMPLANT
SUT MON AB 2-0 SH 27 (SUTURE) ×8
SUT MON AB 2-0 SH27 (SUTURE) ×4 IMPLANT
SUT MON AB 3-0 SH 27 (SUTURE) ×2
SUT MON AB 3-0 SH27 (SUTURE) IMPLANT
SUT NYLON ETHILON 5-0 P-3 1X18 (SUTURE) IMPLANT
SUT VIC AB 0 CT1 18XCR BRD8 (SUTURE) ×3 IMPLANT
SUT VIC AB 0 CT1 36 (SUTURE) ×4 IMPLANT
SUT VIC AB 0 CT1 8-18 (SUTURE) ×6
SUT VIC AB 2-0 CT1 27 (SUTURE) ×2
SUT VIC AB 2-0 CT1 TAPERPNT 27 (SUTURE) ×1 IMPLANT
SUT VIC AB 2-0 SH 27 (SUTURE) ×8
SUT VIC AB 2-0 SH 27XBRD (SUTURE) ×4 IMPLANT
SUT VIC AB 4-0 PS2 27 (SUTURE) IMPLANT
SYR CONTROL 10ML LL (SYRINGE) ×4 IMPLANT
TOWEL OR 17X24 6PK STRL BLUE (TOWEL DISPOSABLE) ×4 IMPLANT
TRAY FOLEY CATH SILVER 14FR (SET/KITS/TRAYS/PACK) ×2 IMPLANT

## 2015-05-02 NOTE — Transfer of Care (Signed)
Immediate Anesthesia Transfer of Care Note  Patient: Alisha Wagner  Procedure(s) Performed: Procedure(s): EXPLORATORY LAPAROTOMY,  MYOMECTOMY (N/A)  Patient Location: PACU  Anesthesia Type:General  Level of Consciousness: awake, alert  and oriented  Airway & Oxygen Therapy: Patient Spontanous Breathing and Patient connected to nasal cannula oxygen  Post-op Assessment: Report given to RN and Post -op Vital signs reviewed and stable  Post vital signs: Reviewed and stable  Last Vitals:  Filed Vitals:   05/02/15 0606  BP: 126/75  Pulse: 82  Temp: 36.8 C  Resp: 18    Complications: No apparent anesthesia complications

## 2015-05-02 NOTE — Progress Notes (Signed)
1816 - Patient c/o nausea after eating part of her broth and drinking Sprite.  Zofran IV given. 1845 - Patient rang call bell and stated that she had vomitted.  400 ml of clear, yellow emesis.  Patient states she feels much better.  Will continue to monitor.

## 2015-05-02 NOTE — Anesthesia Procedure Notes (Signed)
Procedure Name: Intubation Date/Time: 05/02/2015 7:33 AM Performed by: Elenore Paddy Pre-anesthesia Checklist: Patient identified, Timeout performed, Emergency Drugs available, Suction available and Patient being monitored Patient Re-evaluated:Patient Re-evaluated prior to inductionOxygen Delivery Method: Circle system utilized Preoxygenation: Pre-oxygenation with 100% oxygen Intubation Type: IV induction Ventilation: Mask ventilation without difficulty and Oral airway inserted - appropriate to patient size Laryngoscope Size: Mac and 3 Grade View: Grade I Tube type: Oral Tube size: 7.0 mm Number of attempts: 1 Airway Equipment and Method: Stylet and Bite block Placement Confirmation: ETT inserted through vocal cords under direct vision,  positive ETCO2 and breath sounds checked- equal and bilateral Secured at: 20 cm Tube secured with: Tape Dental Injury: Teeth and Oropharynx as per pre-operative assessment

## 2015-05-02 NOTE — Anesthesia Postprocedure Evaluation (Signed)
Anesthesia Post Note  Patient: Alisha Wagner  Procedure(s) Performed: Procedure(s) (LRB): EXPLORATORY LAPAROTOMY,  MYOMECTOMY (N/A)  Anesthesia type: General  Patient location: PACU  Post pain: Pain level controlled  Post assessment: Post-op Vital signs reviewed  Last Vitals:  Filed Vitals:   05/02/15 1130  BP:   Pulse: 77  Temp: 36.6 C  Resp: 15    Post vital signs: Reviewed  Level of consciousness: sedated  Complications: No apparent anesthesia complications

## 2015-05-02 NOTE — Brief Op Note (Signed)
05/02/2015  10:30 AM  PATIENT:  Alisha Wagner  39 y.o. female  PRE-OPERATIVE DIAGNOSIS:  Enlarged Uterine Fibroid, Bilateral Hydrosalpinx  POST-OPERATIVE DIAGNOSIS:  enlarged uterine fibroid, omental lesion  PROCEDURE:  Procedure(s): EXPLORATORY LAPAROTOMY,  MYOMECTOMY (N/A)  SURGEON:  Surgeon(s) and Role:     * Servando Salina, MD - Primary  PHYSICIAN ASSISTANT:   ASSISTANTS: Artelia Laroche, CNM   ANESTHESIA:   general Findings: large fundal 16 cm SS fibroid, 4-5 cm intramural fibroid, broad ligament inclusion cysts, nl tubes and ovaries, nl appendix, nl liver edge EBL:  Total I/O In: 3200 [I.V.:3200] Out: 1050 [Urine:350; Blood:700]  BLOOD ADMINISTERED:none  DRAINS: none   LOCAL MEDICATIONS USED:  MARCAINE     SPECIMEN:  Source of Specimen:  myoma, omental lesion  DISPOSITION OF SPECIMEN:  PATHOLOGY  COUNTS:  YES  TOURNIQUET:  * No tourniquets in log *  DICTATION: .Other Dictation: Dictation Number [id number]  PLAN OF CARE: Admit to inpatient   PATIENT DISPOSITION:  PACU - hemodynamically stable.   Delay start of Pharmacological VTE agent (>24hrs) due to surgical blood loss or risk of bleeding: no

## 2015-05-02 NOTE — Addendum Note (Signed)
Addendum  created 05/02/15 1748 by Jonna Munro, CRNA   Modules edited: Notes Section   Notes Section:  File: [id number]

## 2015-05-02 NOTE — Anesthesia Postprocedure Evaluation (Signed)
  Anesthesia Post-op Note  Patient: Alisha Wagner  Procedure(s) Performed: Procedure(s): EXPLORATORY LAPAROTOMY,  MYOMECTOMY (N/A)  Patient Location: [facility]  Anesthesia Type:General  Level of Consciousness: awake, alert  and oriented  Airway and Oxygen Therapy: Patient Spontanous Breathing  Post-op Pain: none  Post-op Assessment: Post-op Vital signs reviewed, Patient's Cardiovascular Status Stable, Respiratory Function Stable, No signs of Nausea or vomiting and Pain level controlled  Post-op Vital Signs: Reviewed and stable  Last Vitals:  Filed Vitals:   05/02/15 1618  BP: 111/60  Pulse: 81  Temp: 36.8 C  Resp: 16    Complications: No apparent anesthesia complications

## 2015-05-03 LAB — CBC
HCT: 34.6 % — ABNORMAL LOW (ref 36.0–46.0)
Hemoglobin: 12.1 g/dL (ref 12.0–15.0)
MCH: 32.1 pg (ref 26.0–34.0)
MCHC: 35 g/dL (ref 30.0–36.0)
MCV: 91.8 fL (ref 78.0–100.0)
PLATELETS: 151 10*3/uL (ref 150–400)
RBC: 3.77 MIL/uL — ABNORMAL LOW (ref 3.87–5.11)
RDW: 12.6 % (ref 11.5–15.5)
WBC: 12.1 10*3/uL — ABNORMAL HIGH (ref 4.0–10.5)

## 2015-05-03 MED ORDER — IBUPROFEN 800 MG PO TABS: 800.0000 mg | ORAL_TABLET | Freq: Three times a day (TID) | ORAL | Status: DC | PRN

## 2015-05-03 MED ORDER — OXYCODONE-ACETAMINOPHEN 5-325 MG PO TABS: 1.0000 | ORAL_TABLET | ORAL | Status: DC | PRN

## 2015-05-03 NOTE — Progress Notes (Signed)
Subjective: Patient reports tolerating PO, + flatus and no problems voiding. Requesting to go home   Objective: I have reviewed patient's vital signs.  vital signs, intake and output, labs and pathology. Filed Vitals:   05/03/15 1238  BP: 110/49  Pulse: 66  Temp: 98.2 F (36.8 C)  Resp: 18   I/O last 3 completed shifts: In: 6484.4 [P.O.:1010; I.V.:5474.4] Out: 8250 [IBBCW:8889; Emesis/NG output:450; Blood:700]    Lab Results  Component Value Date   WBC 12.1* 05/03/2015   HGB 12.1 05/03/2015   HCT 34.6* 05/03/2015   MCV 91.8 05/03/2015   PLT 151 05/03/2015   Lab Results  Component Value Date   CREATININE 0.79 02/12/2015    EXAM General: alert, cooperative and no distress  Resp: clear to auscultation bilaterally Cardio: regular rate and rhythm, S1, S2 normal, no murmur, click, rub or gallop GI: incision: clean, dry and intact and soft nondistended active bowel sound Extremities: no edema, redness or tenderness in the calves or thighs Vaginal Bleeding: none Path reviewed with pt  Assessment: s/p Procedure(s): EXPLORATORY LAPAROTOMY,  MYOMECTOMY: stable, progressing well and tolerating diet  Plan: Encourage ambulation Discontinue IV fluids Discharge home  D/c instructions reviewed.  Staple removal in office Tuesday and f/u 4 weeks postop  LOS: 1 day    Salisha Bardsley A, MD 05/03/2015 5:16 PM    05/03/2015, 5:16 PM

## 2015-05-03 NOTE — Plan of Care (Signed)
Problem: Phase III Progression Outcomes Goal: Remove staples if indicated/incision care Outcome: Not Applicable Date Met:  68/03/21 To be done in office per MD.

## 2015-05-03 NOTE — Op Note (Signed)
Alisha Wagner, Alisha Wagner NO.:  [id number]  MEDICAL RECORD NO.:  [id number]  LOCATION:  9309                          FACILITY:  [facility]  PHYSICIAN:  Servando Salina, M.D.DATE OF BIRTH:  01-27-1976  DATE OF PROCEDURE:  05/02/2015 DATE OF DISCHARGE:                              OPERATIVE REPORT   PREOPERATIVE DIAGNOSES: 1. Enlarged uterine fibroids. 2. Bilateral hydrosalpinx.  PROCEDURE: 1. Exploratory laparotomy. 2. Myomectomy. 3. Excision of omental lesion.  POSTOPERATIVE DIAGNOSES: 1. Subserosal and intramural fibroids. 2. Omental lesion.  ANESTHESIA:  General.  SURGEON:  Servando Salina, MD  ASSISTANT:  Artelia Laroche, CNM  PROCEDURE:  Under adequate general anesthesia, the patient was placed in a supine position.  Examination under anesthesia revealed a uterus with a mass extending up below the ribs on the right, which was quite mobile, which was amendable to being moved from left to right and little bit inferiorly.  Based on this exam, the patient was then sterilely prepped and draped in the usual fashion.  Indwelling Foley catheter was sterilely placed.  Marcaine 0.25% was injected along the planned Pfannenstiel skin incision site.  Pfannenstiel skin incision was then made, carried down to the rectus fascia.  The rectus fascia was opened transversely.  Rectus fascia was then bluntly and sharply dissected off the rectus muscle in a superior and inferior fashion.  The rectus muscle was split in midline.  The parietal peritoneum was entered bluntly and extended.  Palpation of the abdomen was notable for a large at least 16 cm fundal fibroid, which was not pedunculated as had been reported on the radiological studies.  This was contoured to what was suggested on the clinical exam in the operating room.  Nonetheless, the uterus was towards the right.  The left ovary and right ovary could be both seen on the left side.  There was  multiple  inclusion-like cysts broad ligament bilaterally.  The tubes themselves were normal.  After much manipulation and using the single-tooth tenaculum, the uterus was able to be exteriorized.  Further inspection showed normal ovaries again.  Those cystic like fluid-filled peritoneal collections were noted in the broad ligament bilaterally giving the appearance of a presumed hydrosalpinx on sonogram.  Dilute solution of Pitressin was injected in a vertical fashion on the fundal fibroid.  There was an adjacent fibroid anterior to the left round ligament, measured about 4 cm.  After multiple injections of dilute solution of Pitressin, a vertical incision was made over the serosal surface of the fibroid keeping in mind where the fundal region would be based on the location of both round ligaments.  Using cautery and blunt and sharp dissection, the large fibroid was ultimately enucleated from its base.  Flat based fibroids were also removed.  No other palpable fibroids were noted other than the one that was already stated.  The base of the defect of the fibroid was then closed with interrupted 0-Vicryl figure-of-eight sutures and carried all the way up until the redundant tissue was excised and the serosal surface was approximated with a baseball suturing of 2-0 Monocryl suture.  Bleeding that was noted from prior tenaculum site resulted in several figure-of- eight sutures being  placed for hemostasis and some mattress sutures being placed through the closed fibroid defect area of the bed.  Once that was done, cautery was then used to open up the peritoneal inclusion fluid- filled cystic areas on both sides with large amount of fluid being removed.  The left side fibroid overlying serosa was injected with dilute solution of Pitressin.  A vertical incision was then made and that fibroid was also enucleated from its base.  Closure of the defect was done with 0-Vicryl running stitch and followed by  interrupted 0- Vicryl sutures until the serosal surface was reached, at which time that was again approximated using 2-0 Vicryl suture in a baseball fashion. The abdomen was then irrigated and suctioned.  The omentum was inspected.  There was about a 50-cent size lesion, unclear as to whether or not this is just caked adhesion, but it was nonetheless removed and sent separately.  The omental area was then free-tied with 0-Vicryl suture.  The appendix was noted to be normal.  Normal palpable liver edge.  No other lesions were noted.  The abdomen was then irrigated and suctioned of debris.  Interceed was placed overlying the incisions.  The omentum was then used to cover over that.  The parietal peritoneum was closed with 2-0 Vicryl.  The rectus fascia was closed with 0-Vicryl x2. The subcutaneous area was irrigated.  Small bleeders cauterized. Interrupted 2-0 plain sutures placed, and the skin was approximated using Ethicon staples.  Specimen was the large fibroid, the other smaller ones, and the omental lesion, all sent to Pathology.  This fibroid intraoperatively weighed around 1900 g.  Intraoperative fluid was 3200 mL.  Urine output was 350 mL.  Blood loss was 700 mL.  COMPLICATIONS:  None.  Sponge and instrument counts x2 was correct.  The patient tolerated the procedure well, was transferred to recovery room in stable condition.     Servando Salina, M.D.     [place]/MEDQ  D:  05/02/2015  T:  05/03/2015  Job:  [id number]

## 2015-05-03 NOTE — Progress Notes (Signed)
Pt verbalizes understanding of d/c instructions, medications, follow upappts, when to seek medical care and belongings policy. No questions at this time. IV was removed without complications. Pt escorted to main entrance accompanied by staff member. Pts partner is with her and will be driving her home. Alisha Wagner

## 2015-05-03 NOTE — Discharge Summary (Signed)
Physician Discharge Summary  Patient ID: Alisha Wagner MRN: [id number] DOB/AGE: January 27, 1976 39 y.o.  Admit date: 05/02/2015 Discharge date: 05/03/2015  Admission Diagnoses: enlarged fibroid uterus , hydrosalpinx  Discharge Diagnoses: enlarged fibroid uterus, omental lesion Active Problems:   S/P myomectomy   Discharged Condition: stable  Hospital Course: pt was admitted to [facility] for exp lap, myomectomy, omental lesion. Uncomplicated postop course  Consults: None  Significant Diagnostic Studies: labs:  CBC Latest Ref Rng 05/03/2015 04/30/2015 02/12/2015  WBC 4.0 - 10.5 K/uL 12.1(H) 7.4 7.6  Hemoglobin 12.0 - 15.0 g/dL 12.1 15.2(H) 15.5(H)  Hematocrit 36.0 - 46.0 % 34.6(L) 42.4 44.6  Platelets 150 - 400 K/uL 151 191 203.0     Treatments: surgery: exp lap, myomectomy, omental lesion  Discharge Exam: Blood pressure 110/49, pulse 66, temperature 98.2 F (36.8 C), temperature source Oral, resp. rate 18, height 5\' 6"  (1.676 m), weight 93.895 kg (207 lb), SpO2 96 %. General appearance: alert, cooperative and no distress Resp: clear to auscultation bilaterally Breasts: normal appearance, no masses or tenderness Cardio: regular rate and rhythm, S1, S2 normal, no murmur, click, rub or gallop GI: soft, non-tender; bowel sounds normal; no masses,  no organomegaly Pelvic: deferred Skin: Skin color, texture, turgor normal. No rashes or lesions Incision/Wound: dressing d/c/i (+) staples  Disposition: Final discharge disposition not confirmed  Discharge Instructions    Diet general    Complete by:  As directed      Discharge instructions    Complete by:  As directed   Call if temperature greater than equal to 100.4, nothing per vagina for 4-6 weeks or severe nausea vomiting, increased incisional pain , drainage or redness in the incision site, no straining with bowel movements, showers no bath     Discharge patient    Complete by:  As directed      Discharge wound care:    Complete by:  As  directed   Staple removal in office on Tuesday     Driving Restrictions    Complete by:  As directed   No driving self for two weeks     Increase activity slowly    Complete by:  As directed      Lifting restrictions    Complete by:  As directed   >25 lb for two weeks     May shower / Bathe    Complete by:  As directed      May walk up steps    Complete by:  As directed             Medication List    TAKE these medications        5-HTP PO  Take 1 capsule by mouth daily.     B-COMPLEX PO  Take 1 tablet by mouth daily.     cetirizine 10 MG tablet  Commonly known as:  ZYRTEC  Take 10 mg by mouth daily.     Cholecalciferol 1000 UNITS capsule  Take 1,000 Units by mouth daily.     fish oil-omega-3 fatty acids 1000 MG capsule  Take 2 g by mouth daily.     ibuprofen 800 MG tablet  Commonly known as:  ADVIL,MOTRIN  Take 1 tablet (800 mg total) by mouth every 8 (eight) hours as needed for moderate pain (mild pain).     MULTIVITAMIN PO  Take 1 tablet by mouth daily.     oxyCODONE-acetaminophen 5-325 MG per tablet  Commonly known as:  PERCOCET/ROXICET  Take 1-2 tablets by mouth  every 4 (four) hours as needed for severe pain (moderate to severe pain (when tolerating fluids)).           Follow-up Information    Follow up with Diron Haddon A, MD In 4 weeks.   Specialty:  Obstetrics and Gynecology   Why:  staple removal as well in one week, postop appt in 4 weeks   Contact information:   [address] [place] [address] [phone]       Signed: Ezariah Nace A 05/03/2015, 5:21 PM

## 2015-05-06 ENCOUNTER — Encounter (HOSPITAL_COMMUNITY): Payer: Self-pay | Admitting: Obstetrics and Gynecology

## 2015-06-18 LAB — HM MAMMOGRAPHY: HM MAMMO: NORMAL

## 2015-12-02 IMAGING — CT CT ABD-PELV W/ CM
2 of 4 series · 16 of 46 positions shown, 18 images · IV contrast (omnipaque)
Comparison: None.

CLINICAL DATA: Palpable mass in right lower quadrant of abdomen.

EXAM:
CT ABDOMEN AND PELVIS WITH CONTRAST
TECHNIQUE: Multidetector CT imaging of the abdomen and pelvis was performed
using the standard protocol following bolus administration of
intravenous contrast.
CONTRAST:  100 mL Omnipaque 300

[Series 2: routine abd pel with · axial · 0.80mm/px · z∈[-941,-501]mm · 13 of 98 slices shown, 15 images]
[im 5/98  soft-tissue]
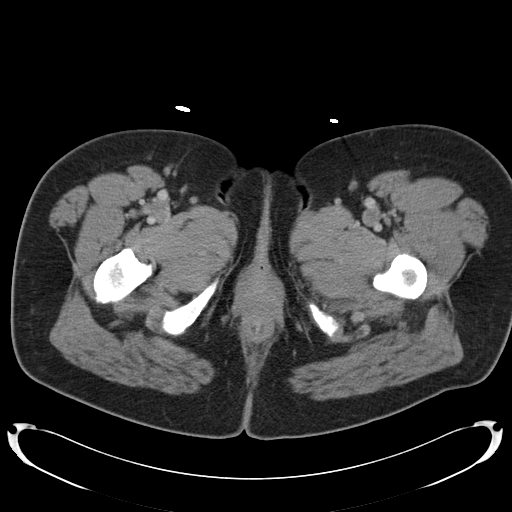
[im 5/98  bone]
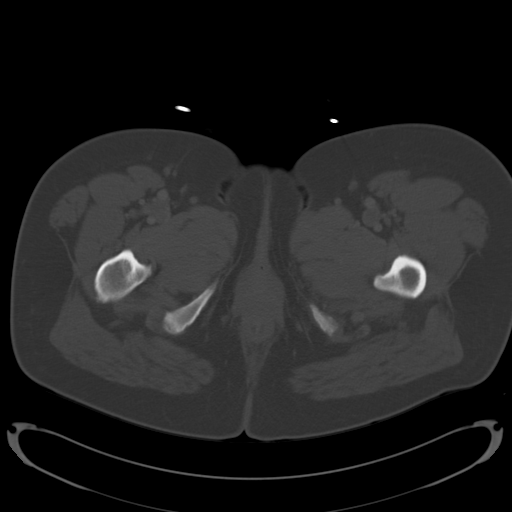
[im 13/98  soft-tissue]
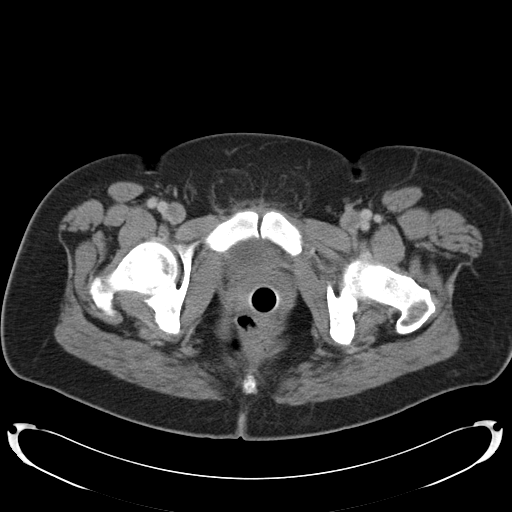
[im 22/98  soft-tissue]
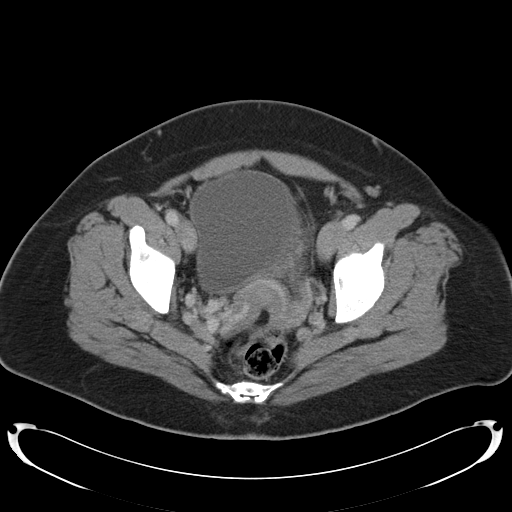
[im 26/98  soft-tissue]
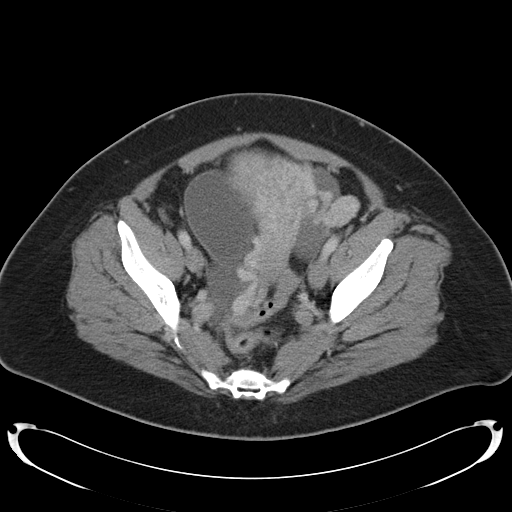
[im 34/98  soft-tissue]
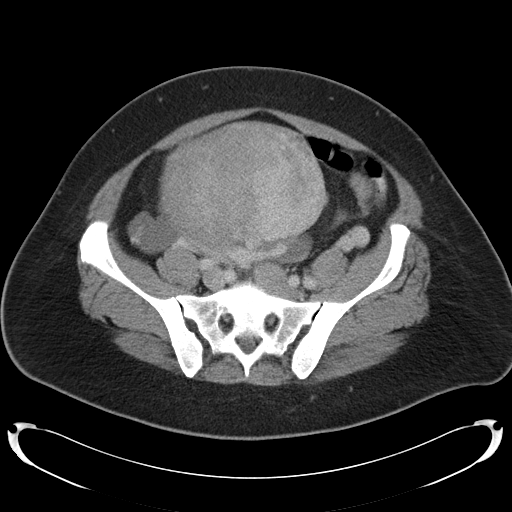
[im 43/98  soft-tissue]
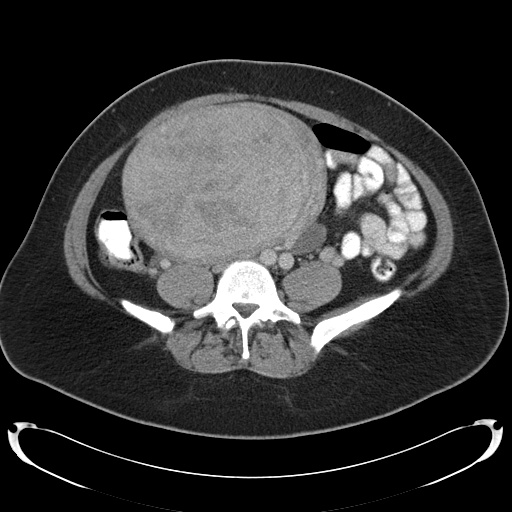
[im 51/98  soft-tissue]
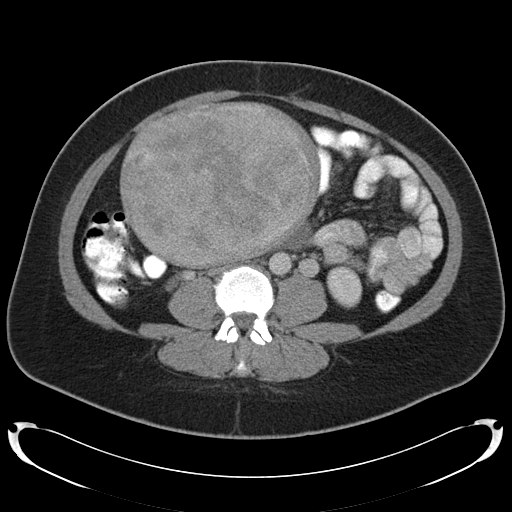
[im 55/98  soft-tissue]
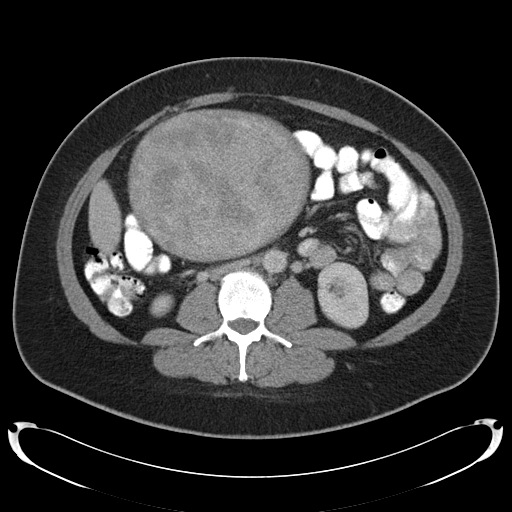
[im 64/98  soft-tissue]
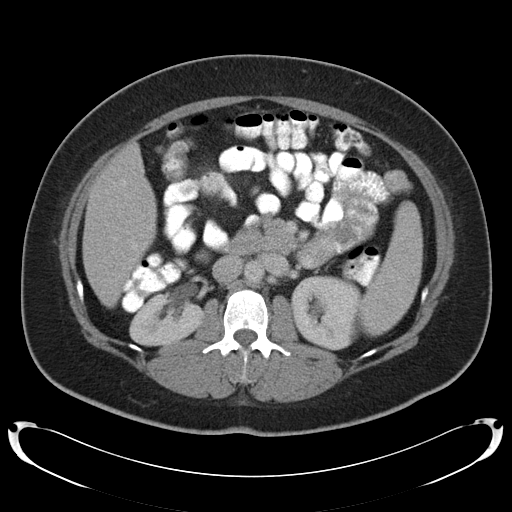
[im 64/98  bone]
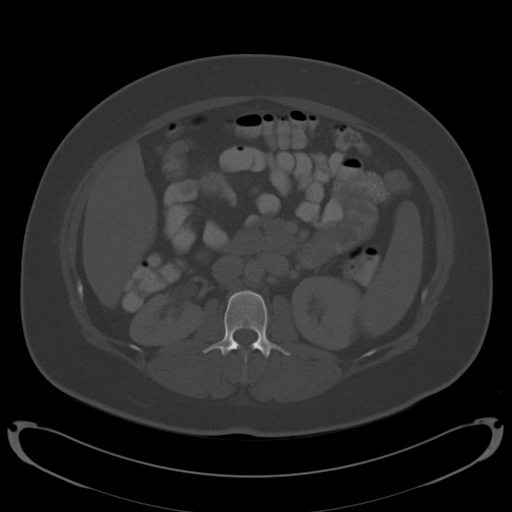
[im 72/98  soft-tissue]
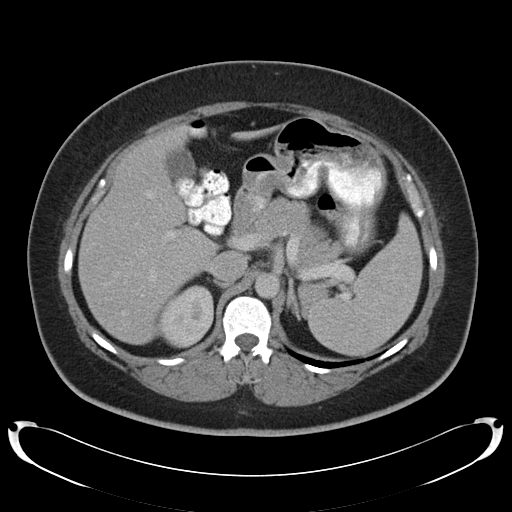
[im 76/98  soft-tissue]
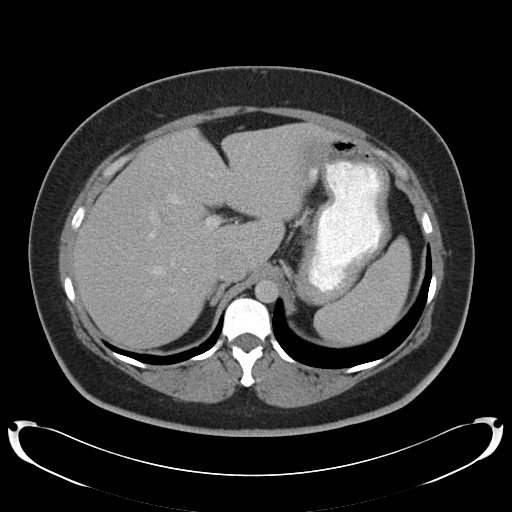
[im 85/98  soft-tissue]
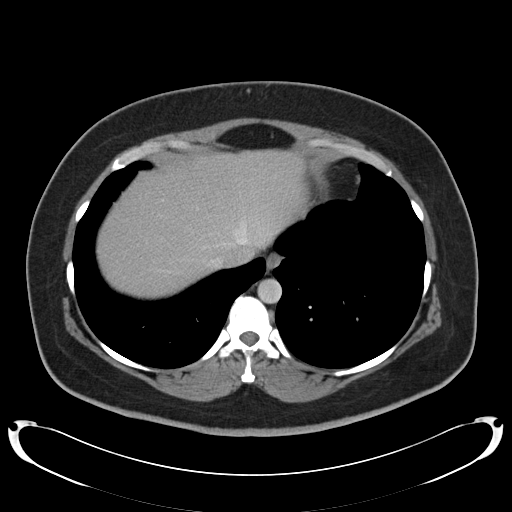
[im 93/98  soft-tissue]
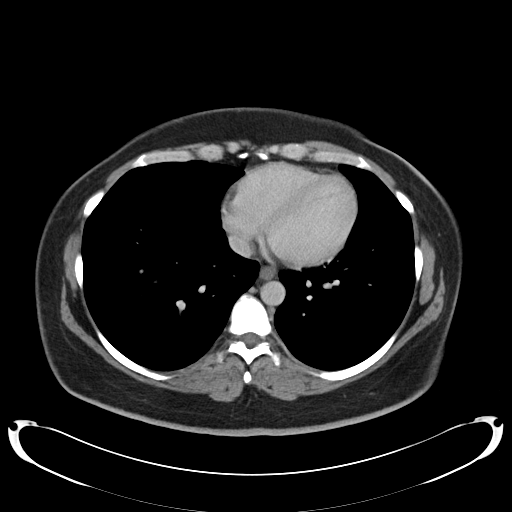

[Series 5: cor routine abd pel with · coronal · 0.80mm/px · 3 of 157 slices shown]
[im 53/157  soft-tissue]
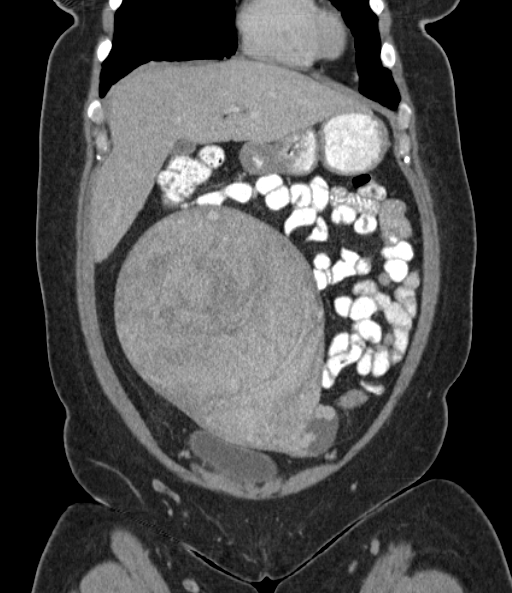
[im 70/157  soft-tissue]
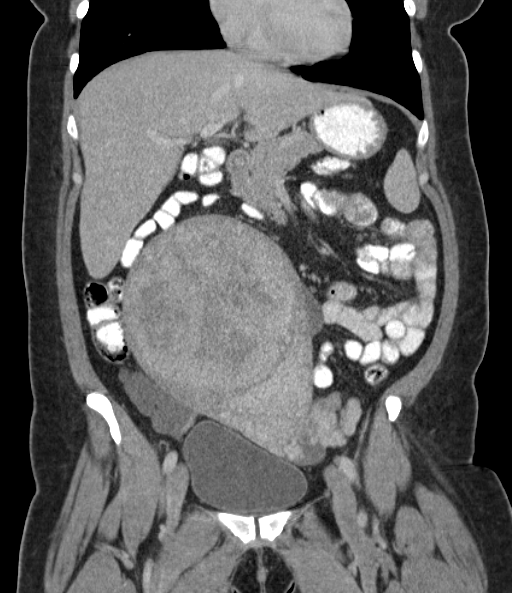
[im 87/157  soft-tissue]
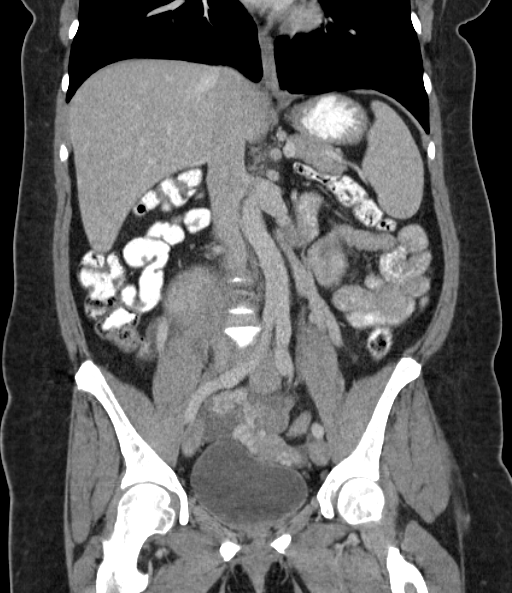

[16 of 46 positions shown; findings below may reference images not displayed]

FINDINGS: Lower Chest:  Unremarkable.

Hepatobiliary: No masses or other significant abnormality
identified. Gallbladder is unremarkable.

Pancreas: No mass, inflammatory changes, or other significant
abnormality identified.

Spleen:  Within normal limits in size and appearance.

Adrenals:  No masses identified.

Kidneys/Urinary Tract:  No evidence of masses or hydronephrosis.

Stomach/Bowel/Peritoneum: No evidence of wall thickening, mass, or
obstruction.

Vascular/Lymphatic: No pathologically enlarged lymph nodes
identified. No other significant abnormality visualized.

Reproductive: Enlarged uterus is seen extending into the mid
abdomen, with a dominant fibroid in the right fundal region 15.8 cm.
At least 1 smaller fibroid is seen in the anterior lower uterine
segment which measures 4.9 cm. Tubular cystic structures are also
seen in both adnexal regions, right side greater than left,
consistent with bilateral hydrosalpinges. Pelvic varices are also
noted in both adnexal regions, left side greater than right.

Other:  None.

Musculoskeletal:  No suspicious bone lesions identified.
IMPRESSION: Markedly enlarged fibroid uterus extending into the mid abdomen,
with largest fibroid measuring 15.8 cm.

Bilateral hydrosalpinges and bilateral adnexal varices.

## 2016-02-21 ENCOUNTER — Telehealth: Payer: Self-pay | Admitting: *Deleted

## 2016-02-21 NOTE — Telephone Encounter (Signed)
Melissa can you help me with this I am not very knowledgeable of who the allergist even are in the area. Please advise, thanks

## 2016-02-21 NOTE — Telephone Encounter (Signed)
Patient questioned if she will need a referral to the allergist, her insurance do not require a referral. She would like a suggestion to a good allergist.  Please advise Pt contact [phone]

## 2016-02-24 NOTE — Telephone Encounter (Signed)
Tried calling pt. No answer. lvm to call back. She can call [facility] [phone] [address] [place]

## 2016-03-09 ENCOUNTER — Encounter: Payer: Self-pay | Admitting: Internal Medicine

## 2016-03-09 ENCOUNTER — Ambulatory Visit (INDEPENDENT_AMBULATORY_CARE_PROVIDER_SITE_OTHER): Payer: BLUE CROSS/BLUE SHIELD | Admitting: Internal Medicine

## 2016-03-09 VITALS — BP 103/73 | HR 90 | Temp 98.1°F | Ht 65.0 in | Wt 220.4 lb

## 2016-03-09 DIAGNOSIS — J309 Allergic rhinitis, unspecified: Secondary | ICD-10-CM | POA: Diagnosis not present

## 2016-03-09 DIAGNOSIS — Z0000 Encounter for general adult medical examination without abnormal findings: Secondary | ICD-10-CM

## 2016-03-09 LAB — CBC WITH DIFFERENTIAL/PLATELET
BASOS ABS: 0 10*3/uL (ref 0.0–0.1)
BASOS PCT: 0.6 % (ref 0.0–3.0)
EOS ABS: 0.3 10*3/uL (ref 0.0–0.7)
Eosinophils Relative: 3.1 % (ref 0.0–5.0)
HEMATOCRIT: 41.8 % (ref 36.0–46.0)
Hemoglobin: 14.3 g/dL (ref 12.0–15.0)
LYMPHS PCT: 31.4 % (ref 12.0–46.0)
Lymphs Abs: 2.6 10*3/uL (ref 0.7–4.0)
MCHC: 34.3 g/dL (ref 30.0–36.0)
MCV: 91.8 fl (ref 78.0–100.0)
MONO ABS: 0.7 10*3/uL (ref 0.1–1.0)
Monocytes Relative: 8.1 % (ref 3.0–12.0)
NEUTROS ABS: 4.8 10*3/uL (ref 1.4–7.7)
Neutrophils Relative %: 56.8 % (ref 43.0–77.0)
PLATELETS: 269 10*3/uL (ref 150.0–400.0)
RBC: 4.55 Mil/uL (ref 3.87–5.11)
RDW: 12.7 % (ref 11.5–15.5)
WBC: 8.4 10*3/uL (ref 4.0–10.5)

## 2016-03-09 LAB — LIPID PANEL
CHOL/HDL RATIO: 5
Cholesterol: 189 mg/dL (ref 0–200)
HDL: 34.5 mg/dL — ABNORMAL LOW (ref >39.00)
NonHDL: 154.95
TRIGLYCERIDES: 348 mg/dL — AB (ref 0.0–149.0)
VLDL: 69.6 mg/dL — ABNORMAL HIGH (ref 0.0–40.0)

## 2016-03-09 LAB — COMPREHENSIVE METABOLIC PANEL
ALT: 24 U/L (ref 0–35)
AST: 22 U/L (ref 0–37)
Albumin: 4.3 g/dL (ref 3.5–5.2)
Alkaline Phosphatase: 32 U/L — ABNORMAL LOW (ref 39–117)
BILIRUBIN TOTAL: 0.4 mg/dL (ref 0.2–1.2)
BUN: 14 mg/dL (ref 6–23)
CALCIUM: 9.8 mg/dL (ref 8.4–10.5)
CHLORIDE: 101 meq/L (ref 96–112)
CO2: 29 meq/L (ref 19–32)
CREATININE: 0.74 mg/dL (ref 0.40–1.20)
GFR: 92.05 mL/min (ref >60.00)
GLUCOSE: 103 mg/dL — AB (ref 70–99)
Potassium: 4.1 mEq/L (ref 3.5–5.1)
Sodium: 137 mEq/L (ref 135–145)
Total Protein: 7.6 g/dL (ref 6.0–8.3)

## 2016-03-09 LAB — LDL CHOLESTEROL, DIRECT: Direct LDL: 90 mg/dL

## 2016-03-09 LAB — TSH: TSH: 0.77 u[IU]/mL (ref 0.35–4.50)

## 2016-03-09 LAB — VITAMIN D 25 HYDROXY (VIT D DEFICIENCY, FRACTURES): VITD: 39.39 ng/mL (ref 30.00–100.00)

## 2016-03-09 NOTE — Assessment & Plan Note (Signed)
General medical exam normal today including breast exam. PAP and pelvic deferred as normal in 10/2013, plan repeat in 2019. Mammogram UTD. Labs ordered. Encouraged healthy diet and exercise. Immunizations UTD.

## 2016-03-09 NOTE — Patient Instructions (Signed)
Health Maintenance, Female Adopting a healthy lifestyle and getting preventive care can go a long way to promote health and wellness. Talk with your health care provider about what schedule of regular examinations is right for you. This is a good chance for you to check in with your provider about disease prevention and staying healthy. In between checkups, there are plenty of things you can do on your own. Experts have done a lot of research about which lifestyle changes and preventive measures are most likely to keep you healthy. Ask your health care provider for more information. WEIGHT AND DIET  Eat a healthy diet  Be sure to include plenty of vegetables, fruits, low-fat dairy products, and lean protein.  Do not eat a lot of foods high in solid fats, added sugars, or salt.  Get regular exercise. This is one of the most important things you can do for your health.  Most adults should exercise for at least 150 minutes each week. The exercise should increase your heart rate and make you sweat (moderate-intensity exercise).  Most adults should also do strengthening exercises at least twice a week. This is in addition to the moderate-intensity exercise.  Maintain a healthy weight  Body mass index (BMI) is a measurement that can be used to identify possible weight problems. It estimates body fat based on height and weight. Your health care provider can help determine your BMI and help you achieve or maintain a healthy weight.  For females 20 years of age and older:   A BMI below 18.5 is considered underweight.  A BMI of 18.5 to 24.9 is normal.  A BMI of 25 to 29.9 is considered overweight.  A BMI of 30 and above is considered obese.  Watch levels of cholesterol and blood lipids  You should start having your blood tested for lipids and cholesterol at 41 years of age, then have this test every 5 years.  You may need to have your cholesterol levels checked more often if:  Your lipid  or cholesterol levels are high.  You are older than 41 years of age.  You are at high risk for heart disease.  CANCER SCREENING   Lung Cancer  Lung cancer screening is recommended for adults 55-80 years old who are at high risk for lung cancer because of a history of smoking.  A yearly low-dose CT scan of the lungs is recommended for people who:  Currently smoke.  Have quit within the past 15 years.  Have at least a 30-pack-year history of smoking. A pack year is smoking an average of one pack of cigarettes a day for 1 year.  Yearly screening should continue until it has been 15 years since you quit.  Yearly screening should stop if you develop a health problem that would prevent you from having lung cancer treatment.  Breast Cancer  Practice breast self-awareness. This means understanding how your breasts normally appear and feel.  It also means doing regular breast self-exams. Let your health care provider know about any changes, no matter how small.  If you are in your 20s or 30s, you should have a clinical breast exam (CBE) by a health care provider every 1-3 years as part of a regular health exam.  If you are 40 or older, have a CBE every year. Also consider having a breast X-ray (mammogram) every year.  If you have a family history of breast cancer, talk to your health care provider about genetic screening.  If you   are at high risk for breast cancer, talk to your health care provider about having an MRI and a mammogram every year.  Breast cancer gene (BRCA) assessment is recommended for women who have family members with BRCA-related cancers. BRCA-related cancers include:  Breast.  Ovarian.  Tubal.  Peritoneal cancers.  Results of the assessment will determine the need for genetic counseling and BRCA1 and BRCA2 testing. Cervical Cancer Your health care provider may recommend that you be screened regularly for cancer of the pelvic organs (ovaries, uterus, and  vagina). This screening involves a pelvic examination, including checking for microscopic changes to the surface of your cervix (Pap test). You may be encouraged to have this screening done every 3 years, beginning at age 21.  For women ages 30-65, health care providers may recommend pelvic exams and Pap testing every 3 years, or they may recommend the Pap and pelvic exam, combined with testing for human papilloma virus (HPV), every 5 years. Some types of HPV increase your risk of cervical cancer. Testing for HPV may also be done on women of any age with unclear Pap test results.  Other health care providers may not recommend any screening for nonpregnant women who are considered low risk for pelvic cancer and who do not have symptoms. Ask your health care provider if a screening pelvic exam is right for you.  If you have had past treatment for cervical cancer or a condition that could lead to cancer, you need Pap tests and screening for cancer for at least 20 years after your treatment. If Pap tests have been discontinued, your risk factors (such as having a new sexual partner) need to be reassessed to determine if screening should resume. Some women have medical problems that increase the chance of getting cervical cancer. In these cases, your health care provider may recommend more frequent screening and Pap tests. Colorectal Cancer  This type of cancer can be detected and often prevented.  Routine colorectal cancer screening usually begins at 41 years of age and continues through 41 years of age.  Your health care provider may recommend screening at an earlier age if you have risk factors for colon cancer.  Your health care provider may also recommend using home test kits to check for hidden blood in the stool.  A small camera at the end of a tube can be used to examine your colon directly (sigmoidoscopy or colonoscopy). This is done to check for the earliest forms of colorectal  cancer.  Routine screening usually begins at age 50.  Direct examination of the colon should be repeated every 5-10 years through 41 years of age. However, you may need to be screened more often if early forms of precancerous polyps or small growths are found. Skin Cancer  Check your skin from head to toe regularly.  Tell your health care provider about any new moles or changes in moles, especially if there is a change in a mole's shape or color.  Also tell your health care provider if you have a mole that is larger than the size of a pencil eraser.  Always use sunscreen. Apply sunscreen liberally and repeatedly throughout the day.  Protect yourself by wearing long sleeves, pants, a wide-brimmed hat, and sunglasses whenever you are outside. HEART DISEASE, DIABETES, AND HIGH BLOOD PRESSURE   High blood pressure causes heart disease and increases the risk of stroke. High blood pressure is more likely to develop in:  People who have blood pressure in the high end   of the normal range (130-139/85-89 mm Hg).  People who are overweight or obese.  People who are African American.  If you are 38-23 years of age, have your blood pressure checked every 3-5 years. If you are 61 years of age or older, have your blood pressure checked every year. You should have your blood pressure measured twice--once when you are at a hospital or clinic, and once when you are not at a hospital or clinic. Record the average of the two measurements. To check your blood pressure when you are not at a hospital or clinic, you can use:  An automated blood pressure machine at a pharmacy.  A home blood pressure monitor.  If you are between 45 years and 39 years old, ask your health care provider if you should take aspirin to prevent strokes.  Have regular diabetes screenings. This involves taking a blood sample to check your fasting blood sugar level.  If you are at a normal weight and have a low risk for diabetes,  have this test once every three years after 41 years of age.  If you are overweight and have a high risk for diabetes, consider being tested at a younger age or more often. PREVENTING INFECTION  Hepatitis B  If you have a higher risk for hepatitis B, you should be screened for this virus. You are considered at high risk for hepatitis B if:  You were born in a country where hepatitis B is common. Ask your health care provider which countries are considered high risk.  Your parents were born in a high-risk country, and you have not been immunized against hepatitis B (hepatitis B vaccine).  You have HIV or AIDS.  You use needles to inject street drugs.  You live with someone who has hepatitis B.  You have had sex with someone who has hepatitis B.  You get hemodialysis treatment.  You take certain medicines for conditions, including cancer, organ transplantation, and autoimmune conditions. Hepatitis C  Blood testing is recommended for:  Everyone born from [birth year] through 1966.  Anyone with known risk factors for hepatitis C. Sexually transmitted infections (STIs)  You should be screened for sexually transmitted infections (STIs) including gonorrhea and chlamydia if:  You are sexually active and are younger than 41 years of age.  You are older than 41 years of age and your health care provider tells you that you are at risk for this type of infection.  Your sexual activity has changed since you were last screened and you are at an increased risk for chlamydia or gonorrhea. Ask your health care provider if you are at risk.  If you do not have HIV, but are at risk, it may be recommended that you take a prescription medicine daily to prevent HIV infection. This is called pre-exposure prophylaxis (PrEP). You are considered at risk if:  You are sexually active and do not regularly use condoms or know the HIV status of your partner(s).  You take drugs by injection.  You are sexually  active with a partner who has HIV. Talk with your health care provider about whether you are at high risk of being infected with HIV. If you choose to begin PrEP, you should first be tested for HIV. You should then be tested every 3 months for as long as you are taking PrEP.  PREGNANCY   If you are premenopausal and you may become pregnant, ask your health care provider about preconception counseling.  If you may  become pregnant, take 400 to 800 micrograms (mcg) of folic acid every day.  If you want to prevent pregnancy, talk to your health care provider about birth control (contraception). OSTEOPOROSIS AND MENOPAUSE   Osteoporosis is a disease in which the bones lose minerals and strength with aging. This can result in serious bone fractures. Your risk for osteoporosis can be identified using a bone density scan.  If you are 61 years of age or older, or if you are at risk for osteoporosis and fractures, ask your health care provider if you should be screened.  Ask your health care provider whether you should take a calcium or vitamin D supplement to lower your risk for osteoporosis.  Menopause may have certain physical symptoms and risks.  Hormone replacement therapy may reduce some of these symptoms and risks. Talk to your health care provider about whether hormone replacement therapy is right for you.  HOME CARE INSTRUCTIONS   Schedule regular health, dental, and eye exams.  Stay current with your immunizations.   Do not use any tobacco products including cigarettes, chewing tobacco, or electronic cigarettes.  If you are pregnant, do not drink alcohol.  If you are breastfeeding, limit how much and how often you drink alcohol.  Limit alcohol intake to no more than 1 drink per day for nonpregnant women. One drink equals 12 ounces of beer, 5 ounces of wine, or 1 ounces of hard liquor.  Do not use street drugs.  Do not share needles.  Ask your health care provider for help if  you need support or information about quitting drugs.  Tell your health care provider if you often feel depressed.  Tell your health care provider if you have ever been abused or do not feel safe at home.   This information is not intended to replace advice given to you by your health care provider. Make sure you discuss any questions you have with your health care provider.   Document Released: 06/15/2011 Document Revised: 12/21/2014 Document Reviewed: 11/01/2013 Elsevier Interactive Patient Education [employer].

## 2016-03-09 NOTE — Progress Notes (Signed)
Subjective:    Patient ID: Alisha Wagner, female    DOB: 26-Mar-1976, 40 y.o.   MRN: [id number]  HPI  40YO female presents for physical exam.  Having allergy symptoms with watery eyes, sneezing, ear pressure. Taking Zyrtec with no improvement. Also using Flonase with some improvement.  Aside from this, feeling well. No concerns today.  Wt Readings from Last 3 Encounters:  03/09/16 220 lb 6 oz (99.961 kg)  05/02/15 207 lb (93.895 kg)  04/30/15 207 lb (93.895 kg)   BP Readings from Last 3 Encounters:  03/09/16 103/73  05/03/15 110/49  04/30/15 128/88    Past Medical History  Diagnosis Date  . Contact dermatitis and other eczema, due to unspecified cause   . Cellulitis and abscess of trunk   . Other and unspecified hyperlipidemia     diet control, no med  . Breast screening, unspecified   . Screening for malignant neoplasm of the cervix   . PONV (postoperative nausea and vomiting)   . Seasonal allergies   . Depression     5-htp  . Anxiety     5-htp   Family History  Problem Relation Age of Onset  . Muscular dystrophy Father   . Heart disease Father     s/p CABG  . Diabetes Mother   . Kidney disease Mother    Past Surgical History  Procedure Laterality Date  . Refractive surgery  12/2014    bilateral lasik  . Hand surgery      right  . Wisdom tooth extraction    . Myomectomy N/A 05/02/2015    Procedure: EXPLORATORY LAPAROTOMY,  MYOMECTOMY;  Surgeon: Servando Salina, MD;  Location: [facility];  Service: Gynecology;  Laterality: N/A;   Social History   Social History  . Marital Status: Single    Spouse Name: N/A  . Number of Children: N/A  . Years of Education: N/A   Social History Main Topics  . Smoking status: Never Smoker   . Smokeless tobacco: Never Used  . Alcohol Use: Yes     Comment: occassionally  . Drug Use: No  . Sexual Activity: Yes    Birth Control/ Protection: None   Other Topics Concern  . None   Social History Narrative    Review  of Systems  Constitutional: Negative for fever, chills, appetite change, fatigue and unexpected weight change.  HENT: Positive for congestion, postnasal drip, rhinorrhea and sneezing. Negative for sinus pressure, sore throat, trouble swallowing and voice change.   Eyes: Positive for discharge, redness and itching. Negative for photophobia, pain and visual disturbance.  Respiratory: Negative for shortness of breath.   Cardiovascular: Negative for chest pain and leg swelling.  Gastrointestinal: Negative for nausea, vomiting, abdominal pain, diarrhea and constipation.  Musculoskeletal: Negative for myalgias and arthralgias.  Skin: Negative for color change and rash.  Allergic/Immunologic: Positive for environmental allergies.  Hematological: Negative for adenopathy. Does not bruise/bleed easily.  Psychiatric/Behavioral: Negative for suicidal ideas, sleep disturbance and dysphoric mood. The patient is not nervous/anxious.        Objective:    BP 103/73 mmHg  Pulse 90  Temp(Src) 98.1 F (36.7 C) (Oral)  Ht 5\' 5"  (1.651 m)  Wt 220 lb 6 oz (99.961 kg)  BMI 36.67 kg/m2  SpO2 96%  LMP 02/26/2016 Physical Exam  Constitutional: She is oriented to person, place, and time. She appears well-developed and well-nourished. No distress.  HENT:  Head: Normocephalic and atraumatic.  Right Ear: External ear normal.  Left Ear:  External ear normal.  Nose: Nose normal.  Mouth/Throat: Oropharynx is clear and moist. No oropharyngeal exudate.  Eyes: Conjunctivae are normal. Pupils are equal, round, and reactive to light. Right eye exhibits no discharge. Left eye exhibits no discharge. No scleral icterus.  Neck: Normal range of motion. Neck supple. No tracheal deviation present. No thyromegaly present.  Cardiovascular: Normal rate, regular rhythm, normal heart sounds and intact distal pulses.  Exam reveals no gallop and no friction rub.   No murmur heard. Pulmonary/Chest: Effort normal and breath sounds  normal. No accessory muscle usage. No tachypnea. No respiratory distress. She has no decreased breath sounds. She has no wheezes. She has no rales. She exhibits no tenderness. Right breast exhibits no inverted nipple, no mass, no nipple discharge, no skin change and no tenderness. Left breast exhibits no inverted nipple, no mass, no nipple discharge, no skin change and no tenderness. Breasts are symmetrical.  Abdominal: Soft. Bowel sounds are normal. She exhibits no distension and no mass. There is no tenderness. There is no rebound and no guarding.  Musculoskeletal: Normal range of motion. She exhibits no edema or tenderness.  Lymphadenopathy:    She has no cervical adenopathy.  Neurological: She is alert and oriented to person, place, and time. No cranial nerve deficit. She exhibits normal muscle tone. Coordination normal.  Skin: Skin is warm and dry. No rash noted. She is not diaphoretic. No erythema. No pallor.  Psychiatric: She has a normal mood and affect. Her behavior is normal. Judgment and thought content normal.          Assessment & Plan:   Problem List Items Addressed This Visit      Unprioritized   Routine general medical examination at a health care facility - Primary    General medical exam normal today including breast exam. PAP and pelvic deferred as normal in 10/2013, plan repeat in 2019. Mammogram UTD. Labs ordered. Encouraged healthy diet and exercise. Immunizations UTD.      Relevant Orders   CBC with Differential/Platelet   Comprehensive metabolic panel   Lipid panel   TSH   VITAMIN D 25 Hydroxy (Vit-D Deficiency, Fractures)    Other Visit Diagnoses    Allergic rhinitis, unspecified allergic rhinitis type        Relevant Orders    Ambulatory referral to Allergy        Return in about 1 year (around 03/09/2017) for Physical.  Ronette Deter, MD [facility]

## 2016-03-09 NOTE — Progress Notes (Signed)
Pre visit review using our clinic review tool, if applicable. No additional management support is needed unless otherwise documented below in the visit note. 

## 2016-04-23 DIAGNOSIS — J301 Allergic rhinitis due to pollen: Secondary | ICD-10-CM | POA: Diagnosis not present

## 2016-05-12 DIAGNOSIS — J301 Allergic rhinitis due to pollen: Secondary | ICD-10-CM | POA: Diagnosis not present

## 2016-07-02 DIAGNOSIS — J302 Other seasonal allergic rhinitis: Secondary | ICD-10-CM | POA: Diagnosis not present

## 2016-07-02 DIAGNOSIS — R0982 Postnasal drip: Secondary | ICD-10-CM | POA: Diagnosis not present

## 2017-03-11 ENCOUNTER — Encounter: Payer: BLUE CROSS/BLUE SHIELD | Admitting: Internal Medicine

## 2017-05-04 ENCOUNTER — Encounter: Payer: Self-pay | Admitting: Primary Care

## 2017-05-04 ENCOUNTER — Ambulatory Visit (INDEPENDENT_AMBULATORY_CARE_PROVIDER_SITE_OTHER): Payer: BLUE CROSS/BLUE SHIELD | Admitting: Primary Care

## 2017-05-04 VITALS — BP 118/74 | HR 85 | Temp 98.0°F | Ht 65.0 in | Wt 224.0 lb

## 2017-05-04 DIAGNOSIS — E781 Pure hyperglyceridemia: Secondary | ICD-10-CM | POA: Diagnosis not present

## 2017-05-04 DIAGNOSIS — F411 Generalized anxiety disorder: Secondary | ICD-10-CM | POA: Diagnosis not present

## 2017-05-04 LAB — LIPID PANEL
Cholesterol: 200 mg/dL (ref 0–200)
HDL: 36 mg/dL — ABNORMAL LOW (ref >39.00)
NonHDL: 164.23
TRIGLYCERIDES: 296 mg/dL — AB (ref 0.0–149.0)
Total CHOL/HDL Ratio: 6
VLDL: 59.2 mg/dL — ABNORMAL HIGH (ref 0.0–40.0)

## 2017-05-04 LAB — LDL CHOLESTEROL, DIRECT: Direct LDL: 97 mg/dL

## 2017-05-04 NOTE — Patient Instructions (Signed)
You will be contacted regarding your referral to therapy.  Please let us know if you have not heard back within one week.   Complete lab work prior to leaving today. I will notify you of your results once received.   It was a pleasure to meet you today! Please don't hesitate to call me with any questions. Welcome to [facility]!

## 2017-05-04 NOTE — Assessment & Plan Note (Signed)
Intermittent during school semesters, much improved during summer months. Discussed several options, will have her start with therapy first while she's out of school. If symptoms return during Fall 2018 semester consider starting Buspar (successful in past) or low dose SSRI.

## 2017-05-04 NOTE — Assessment & Plan Note (Signed)
Trigs above goal in March 2017, lipid panel pending today. Overall healthy diet, discussed to increase exercise. Continue Fish Oil 1000 mg daily.

## 2017-05-04 NOTE — Progress Notes (Signed)
Subjective:    Patient ID: Alisha Wagner, female    DOB: [birth year], 41 y.o.   MRN: [id number]  HPI  Alisha Wagner is a 41 year old female who presents today to transfer care from [facility].   1) Hypertriglyceridemia: Lipid panel one year ago with trigs at 348. LDL of 90, TC of 189. She is currently taking Fish Oil 1000 mg once daily. She has a family history of heart disease with hyperlipidemia in her Father.  Diet currently consists of:  Breakfast: Cold cereal, oatmeal, fruit, egg, toast Lunch: Granola bar, fruit, home made burritos, ham and cheese biscuit, vegetables, fruit. Dinner: Same as lunch. Vegetables. Snacks: None Desserts: 3-4 times weekly Beverages: Coffee, water   Exercise: She does home exercises (cardio) three days weekly for 20 minutes.    2) Intermittent Anxiety: Previously taking 5-HTP (herbal medication) and had great success. She historically took this for several weeks during high stress times with improvement. She has noticed intermittent anxiety and stress that occurs during school times such as Fall and Spring semesters. She was very anxious during the school semesters, especially this last Fall. During those times she experiences daily anxiety, stress, worry. She was previously managed on Buspar in high school with improvement. Since school ended in early May, her stress and anxiety has significantly reduced.  Review of Systems  Constitutional: Negative for fatigue.  Respiratory: Negative for shortness of breath.   Cardiovascular: Negative for chest pain and palpitations.  Neurological: Negative for dizziness and headaches.  Psychiatric/Behavioral:       Intermittent anxiety       Past Medical History:  Diagnosis Date  . Anxiety    5-htp  . Breast screening, unspecified   . Cellulitis and abscess of trunk   . Contact dermatitis and other eczema, due to unspecified cause   . Depression    5-htp  . Other and unspecified hyperlipidemia    diet  control, no med  . PONV (postoperative nausea and vomiting)   . Screening for malignant neoplasm of the cervix   . Seasonal allergies      Social History   Social History  . Marital status: Single    Spouse name: N/A  . Number of children: N/A  . Years of education: N/A   Occupational History  . Not on file.   Social History Main Topics  . Smoking status: Never Smoker  . Smokeless tobacco: Never Used  . Alcohol use Yes     Comment: occassionally  . Drug use: No  . Sexual activity: Yes    Birth control/ protection: None   Other Topics Concern  . Not on file   Social History Narrative   No children.   Married.   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   Enjoys gardening, baking/cooking, Firefighter.    Past Surgical History:  Procedure Laterality Date  . HAND SURGERY     right  . MYOMECTOMY N/A 05/02/2015   Procedure: EXPLORATORY LAPAROTOMY,  MYOMECTOMY;  Surgeon: Servando Salina, MD;  Location: [facility];  Service: Gynecology;  Laterality: N/A;  . REFRACTIVE SURGERY  12/2014   bilateral lasik  . WISDOM TOOTH EXTRACTION      Family History  Problem Relation Age of Onset  . Muscular dystrophy Father   . Heart disease Father        s/p CABG  . Diabetes Mother   . Kidney disease Mother     No Known Allergies  Current Outpatient Prescriptions on File Prior to Visit  Medication Sig Dispense Refill  . B Complex-Biotin-FA (B-COMPLEX PO) Take 1 tablet by mouth daily.     . cetirizine (ZYRTEC) 10 MG tablet Take 10 mg by mouth daily.    . Cholecalciferol 1000 UNITS capsule Take 1,000 Units by mouth daily.      . fish oil-omega-3 fatty acids 1000 MG capsule Take 2 g by mouth daily.      . Multiple Vitamins-Minerals (MULTIVITAMIN PO) Take 1 tablet by mouth daily.     No current facility-administered medications on file prior to visit.     BP 118/74   Pulse 85   Temp 98 F (36.7 C) (Oral)   Ht 5\' 5"  (1.651 m)   Wt 224 lb (101.6 kg)   LMP  04/23/2017   SpO2 95%   BMI 37.28 kg/m    Objective:   Physical Exam  Constitutional: She appears well-nourished.  Neck: Neck supple.  Cardiovascular: Normal rate and regular rhythm.   Pulmonary/Chest: Effort normal and breath sounds normal.  Skin: Skin is warm and dry.  Psychiatric: She has a normal mood and affect.          Assessment & Plan:

## 2017-07-28 ENCOUNTER — Ambulatory Visit (INDEPENDENT_AMBULATORY_CARE_PROVIDER_SITE_OTHER): Payer: BLUE CROSS/BLUE SHIELD | Admitting: Psychology

## 2017-07-28 DIAGNOSIS — F4323 Adjustment disorder with mixed anxiety and depressed mood: Secondary | ICD-10-CM | POA: Diagnosis not present

## 2017-08-12 ENCOUNTER — Ambulatory Visit (INDEPENDENT_AMBULATORY_CARE_PROVIDER_SITE_OTHER): Payer: BLUE CROSS/BLUE SHIELD | Admitting: Psychology

## 2017-08-12 DIAGNOSIS — F4323 Adjustment disorder with mixed anxiety and depressed mood: Secondary | ICD-10-CM | POA: Diagnosis not present

## 2017-08-27 ENCOUNTER — Ambulatory Visit (INDEPENDENT_AMBULATORY_CARE_PROVIDER_SITE_OTHER): Payer: BLUE CROSS/BLUE SHIELD | Admitting: Psychology

## 2017-08-27 DIAGNOSIS — F4323 Adjustment disorder with mixed anxiety and depressed mood: Secondary | ICD-10-CM | POA: Diagnosis not present

## 2017-09-27 ENCOUNTER — Ambulatory Visit: Payer: BLUE CROSS/BLUE SHIELD | Admitting: Psychology

## 2017-10-14 ENCOUNTER — Ambulatory Visit (INDEPENDENT_AMBULATORY_CARE_PROVIDER_SITE_OTHER): Payer: BLUE CROSS/BLUE SHIELD | Admitting: Psychology

## 2017-10-14 DIAGNOSIS — F4323 Adjustment disorder with mixed anxiety and depressed mood: Secondary | ICD-10-CM

## 2017-10-29 ENCOUNTER — Ambulatory Visit (INDEPENDENT_AMBULATORY_CARE_PROVIDER_SITE_OTHER): Payer: BLUE CROSS/BLUE SHIELD | Admitting: Psychology

## 2017-10-29 DIAGNOSIS — F331 Major depressive disorder, recurrent, moderate: Secondary | ICD-10-CM | POA: Diagnosis not present

## 2017-10-29 DIAGNOSIS — F4323 Adjustment disorder with mixed anxiety and depressed mood: Secondary | ICD-10-CM | POA: Diagnosis not present

## 2017-11-09 ENCOUNTER — Ambulatory Visit: Payer: BLUE CROSS/BLUE SHIELD | Admitting: Primary Care

## 2017-11-24 ENCOUNTER — Ambulatory Visit (INDEPENDENT_AMBULATORY_CARE_PROVIDER_SITE_OTHER): Payer: BLUE CROSS/BLUE SHIELD | Admitting: Psychology

## 2017-11-24 DIAGNOSIS — F4323 Adjustment disorder with mixed anxiety and depressed mood: Secondary | ICD-10-CM | POA: Diagnosis not present

## 2017-11-24 DIAGNOSIS — F331 Major depressive disorder, recurrent, moderate: Secondary | ICD-10-CM | POA: Diagnosis not present

## 2017-11-30 ENCOUNTER — Ambulatory Visit: Payer: Self-pay | Admitting: Primary Care

## 2017-12-01 ENCOUNTER — Encounter: Payer: Self-pay | Admitting: Primary Care

## 2017-12-01 ENCOUNTER — Ambulatory Visit (INDEPENDENT_AMBULATORY_CARE_PROVIDER_SITE_OTHER): Payer: BLUE CROSS/BLUE SHIELD | Admitting: Primary Care

## 2017-12-01 DIAGNOSIS — F411 Generalized anxiety disorder: Secondary | ICD-10-CM

## 2017-12-01 DIAGNOSIS — E781 Pure hyperglyceridemia: Secondary | ICD-10-CM | POA: Diagnosis not present

## 2017-12-01 LAB — COMPREHENSIVE METABOLIC PANEL
ALT: 17 U/L (ref 0–35)
AST: 16 U/L (ref 0–37)
Albumin: 4.1 g/dL (ref 3.5–5.2)
Alkaline Phosphatase: 32 U/L — ABNORMAL LOW (ref 39–117)
BUN: 11 mg/dL (ref 6–23)
CO2: 29 meq/L (ref 19–32)
Calcium: 9.2 mg/dL (ref 8.4–10.5)
Chloride: 102 mEq/L (ref 96–112)
Creatinine, Ser: 0.84 mg/dL (ref 0.40–1.20)
GFR: 78.85 mL/min (ref >60.00)
GLUCOSE: 91 mg/dL (ref 70–99)
Potassium: 3.9 mEq/L (ref 3.5–5.1)
SODIUM: 137 meq/L (ref 135–145)
Total Bilirubin: 0.6 mg/dL (ref 0.2–1.2)
Total Protein: 7.5 g/dL (ref 6.0–8.3)

## 2017-12-01 LAB — LIPID PANEL
CHOL/HDL RATIO: 5
Cholesterol: 172 mg/dL (ref 0–200)
HDL: 35.8 mg/dL — ABNORMAL LOW (ref >39.00)
NONHDL: 135.95
TRIGLYCERIDES: 237 mg/dL — AB (ref 0.0–149.0)
VLDL: 47.4 mg/dL — ABNORMAL HIGH (ref 0.0–40.0)

## 2017-12-01 LAB — LDL CHOLESTEROL, DIRECT: Direct LDL: 107 mg/dL

## 2017-12-01 NOTE — Assessment & Plan Note (Signed)
Repeat lipid panel pending. Commended her on regular exercise and improvement in diet. Consider low dose Crestor vs Tricor for treatment depending on lipid panel. If trigs are mostly the problem then would go for Tricor.

## 2017-12-01 NOTE — Patient Instructions (Signed)
Stop by the lab prior to leaving today. I will notify you of your results once received.   Conitnue exercising. You should be getting 150 minutes of moderate intensity exercise weekly.  Continue to monitor your diet. Ensure you are getting enough vegetables, fruit, whole grains.  Ensure you are consuming 64 ounces of water daily.  It was a pleasure to see you today!

## 2017-12-01 NOTE — Progress Notes (Signed)
Subjective:    Patient ID: Alisha Wagner, female    DOB: 1976/03/11, 41 y.o.   MRN: [id number]  HPI  Alisha Wagner is a 41 year old female who presents today for follow up.  1) GAD: Currently following with therapy which has been very helpful. She's following every other week. She's no longer taking 5-HTP supplement. Overall she's feeling improved.   2) Hypertriglyceridemia: Currently managed on Fish Oil 1000 mg twice daily. Lipid panel in May 2018 with Trigs at 296, TC of 200, LDL of 97. Strong family history of hyperlipidemia in her father. She was encouraged to work on her diet and start exercising.   Since her last visit she's been keeping a food journal and noticed that she was eating a lot of "junk" during the day in between meals. She has since cut that back. She's currently exercising 150 minutes weekly for which she's been doing since November. She has been exercising at some level since her last visit in May 2018. She is open to a medication to lower her levels if needed.    Review of Systems  Respiratory: Negative for shortness of breath.   Cardiovascular: Negative for chest pain.  Neurological: Negative for dizziness and headaches.  Psychiatric/Behavioral:       See HPI       Past Medical History:  Diagnosis Date  . Anxiety    5-htp  . Breast screening, unspecified   . Cellulitis and abscess of trunk   . Contact dermatitis and other eczema, due to unspecified cause   . Depression    5-htp  . Other and unspecified hyperlipidemia    diet control, no med  . PONV (postoperative nausea and vomiting)   . Screening for malignant neoplasm of the cervix   . Seasonal allergies      Social History   Socioeconomic History  . Marital status: Single    Spouse name: Not on file  . Number of children: Not on file  . Years of education: Not on file  . Highest education level: Not on file  Social Needs  . Financial resource strain: Not on file  . Food insecurity - worry: Not  on file  . Food insecurity - inability: Not on file  . Transportation needs - medical: Not on file  . Transportation needs - non-medical: Not on file  Occupational History  . Not on file  Tobacco Use  . Smoking status: Never Smoker  . Smokeless tobacco: Never Used  Substance and Sexual Activity  . Alcohol use: Yes    Comment: occassionally  . Drug use: No  . Sexual activity: Yes    Birth control/protection: None  Other Topics Concern  . Not on file  Social History Narrative   No children.   Married.   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   Enjoys gardening, baking/cooking, Firefighter.    Past Surgical History:  Procedure Laterality Date  . HAND SURGERY     right  . MYOMECTOMY N/A 05/02/2015   Procedure: EXPLORATORY LAPAROTOMY,  MYOMECTOMY;  Surgeon: Servando Salina, MD;  Location: [facility];  Service: Gynecology;  Laterality: N/A;  . REFRACTIVE SURGERY  12/2014   bilateral lasik  . WISDOM TOOTH EXTRACTION      Family History  Problem Relation Age of Onset  . Muscular dystrophy Father   . Heart disease Father        s/p CABG  . Diabetes Mother   .  Kidney disease Mother     No Known Allergies  Current Outpatient Medications on File Prior to Visit  Medication Sig Dispense Refill  . B Complex-Biotin-FA (B-COMPLEX PO) Take 1 tablet by mouth daily.     . cetirizine (ZYRTEC) 10 MG tablet Take 10 mg by mouth daily.    . Cholecalciferol 1000 UNITS capsule Take 1,000 Units by mouth daily.      . fish oil-omega-3 fatty acids 1000 MG capsule Take 2 g by mouth daily.      . Multiple Vitamins-Minerals (MULTIVITAMIN PO) Take 1 tablet by mouth daily.     No current facility-administered medications on file prior to visit.     BP 112/70   Pulse 70   Temp 97.6 F (36.4 C) (Oral)   Ht 5\' 5"  (1.651 m)   Wt 223 lb 12.8 oz (101.5 kg)   LMP 11/20/2017   SpO2 96%   BMI 37.24 kg/m    Objective:   Physical Exam  Constitutional: She appears  well-nourished.  Neck: Neck supple.  Cardiovascular: Normal rate and regular rhythm.  Pulmonary/Chest: Effort normal and breath sounds normal.  Skin: Skin is warm and dry.  Psychiatric: She has a normal mood and affect.          Assessment & Plan:

## 2017-12-01 NOTE — Assessment & Plan Note (Signed)
Improved since therapy, compliant to treatment. Continue to monitor. No medication needed.

## 2017-12-10 ENCOUNTER — Ambulatory Visit (INDEPENDENT_AMBULATORY_CARE_PROVIDER_SITE_OTHER): Payer: BLUE CROSS/BLUE SHIELD | Admitting: Psychology

## 2017-12-10 DIAGNOSIS — F331 Major depressive disorder, recurrent, moderate: Secondary | ICD-10-CM | POA: Diagnosis not present

## 2017-12-10 DIAGNOSIS — F4323 Adjustment disorder with mixed anxiety and depressed mood: Secondary | ICD-10-CM | POA: Diagnosis not present

## 2017-12-24 ENCOUNTER — Ambulatory Visit (INDEPENDENT_AMBULATORY_CARE_PROVIDER_SITE_OTHER): Payer: BLUE CROSS/BLUE SHIELD | Admitting: Psychology

## 2017-12-24 DIAGNOSIS — F331 Major depressive disorder, recurrent, moderate: Secondary | ICD-10-CM

## 2017-12-24 DIAGNOSIS — F4323 Adjustment disorder with mixed anxiety and depressed mood: Secondary | ICD-10-CM

## 2018-01-07 ENCOUNTER — Ambulatory Visit (INDEPENDENT_AMBULATORY_CARE_PROVIDER_SITE_OTHER): Payer: BLUE CROSS/BLUE SHIELD | Admitting: Psychology

## 2018-01-07 DIAGNOSIS — F331 Major depressive disorder, recurrent, moderate: Secondary | ICD-10-CM | POA: Diagnosis not present

## 2018-01-07 DIAGNOSIS — F4323 Adjustment disorder with mixed anxiety and depressed mood: Secondary | ICD-10-CM | POA: Diagnosis not present

## 2018-01-17 ENCOUNTER — Ambulatory Visit (INDEPENDENT_AMBULATORY_CARE_PROVIDER_SITE_OTHER): Payer: BLUE CROSS/BLUE SHIELD | Admitting: Psychology

## 2018-01-17 DIAGNOSIS — F4323 Adjustment disorder with mixed anxiety and depressed mood: Secondary | ICD-10-CM

## 2018-01-17 DIAGNOSIS — F331 Major depressive disorder, recurrent, moderate: Secondary | ICD-10-CM

## 2018-02-02 ENCOUNTER — Ambulatory Visit (INDEPENDENT_AMBULATORY_CARE_PROVIDER_SITE_OTHER): Payer: BLUE CROSS/BLUE SHIELD | Admitting: Psychology

## 2018-02-02 DIAGNOSIS — F4323 Adjustment disorder with mixed anxiety and depressed mood: Secondary | ICD-10-CM | POA: Diagnosis not present

## 2018-02-02 DIAGNOSIS — F331 Major depressive disorder, recurrent, moderate: Secondary | ICD-10-CM

## 2018-02-16 ENCOUNTER — Ambulatory Visit (INDEPENDENT_AMBULATORY_CARE_PROVIDER_SITE_OTHER): Payer: BLUE CROSS/BLUE SHIELD | Admitting: Psychology

## 2018-02-16 DIAGNOSIS — F331 Major depressive disorder, recurrent, moderate: Secondary | ICD-10-CM | POA: Diagnosis not present

## 2018-02-16 DIAGNOSIS — F4323 Adjustment disorder with mixed anxiety and depressed mood: Secondary | ICD-10-CM

## 2018-02-22 ENCOUNTER — Other Ambulatory Visit: Payer: Self-pay | Admitting: Primary Care

## 2018-02-22 DIAGNOSIS — E781 Pure hyperglyceridemia: Secondary | ICD-10-CM

## 2018-02-23 ENCOUNTER — Other Ambulatory Visit (INDEPENDENT_AMBULATORY_CARE_PROVIDER_SITE_OTHER): Payer: BLUE CROSS/BLUE SHIELD

## 2018-02-23 DIAGNOSIS — E781 Pure hyperglyceridemia: Secondary | ICD-10-CM | POA: Diagnosis not present

## 2018-02-23 LAB — LIPID PANEL
Cholesterol: 192 mg/dL (ref 0–200)
HDL: 34.6 mg/dL — ABNORMAL LOW (ref >39.00)
NonHDL: 157.02
Total CHOL/HDL Ratio: 6
Triglycerides: 316 mg/dL — ABNORMAL HIGH (ref 0.0–149.0)
VLDL: 63.2 mg/dL — ABNORMAL HIGH (ref 0.0–40.0)

## 2018-02-23 LAB — LDL CHOLESTEROL, DIRECT: LDL DIRECT: 107 mg/dL

## 2018-02-24 ENCOUNTER — Encounter: Payer: Self-pay | Admitting: Primary Care

## 2018-02-24 DIAGNOSIS — E781 Pure hyperglyceridemia: Secondary | ICD-10-CM

## 2018-02-25 MED ORDER — ROSUVASTATIN CALCIUM 5 MG PO TABS: 5.0000 mg | ORAL_TABLET | Freq: Every evening | ORAL | 3 refills | Status: DC

## 2018-03-02 ENCOUNTER — Ambulatory Visit (INDEPENDENT_AMBULATORY_CARE_PROVIDER_SITE_OTHER): Payer: BLUE CROSS/BLUE SHIELD | Admitting: Psychology

## 2018-03-02 DIAGNOSIS — F4323 Adjustment disorder with mixed anxiety and depressed mood: Secondary | ICD-10-CM | POA: Diagnosis not present

## 2018-03-08 DIAGNOSIS — F411 Generalized anxiety disorder: Secondary | ICD-10-CM | POA: Diagnosis not present

## 2018-03-08 DIAGNOSIS — F331 Major depressive disorder, recurrent, moderate: Secondary | ICD-10-CM | POA: Diagnosis not present

## 2018-03-16 ENCOUNTER — Ambulatory Visit (INDEPENDENT_AMBULATORY_CARE_PROVIDER_SITE_OTHER): Payer: BLUE CROSS/BLUE SHIELD | Admitting: Psychology

## 2018-03-16 DIAGNOSIS — F4323 Adjustment disorder with mixed anxiety and depressed mood: Secondary | ICD-10-CM | POA: Diagnosis not present

## 2018-03-30 ENCOUNTER — Ambulatory Visit (INDEPENDENT_AMBULATORY_CARE_PROVIDER_SITE_OTHER): Payer: BLUE CROSS/BLUE SHIELD | Admitting: Psychology

## 2018-03-30 DIAGNOSIS — F4323 Adjustment disorder with mixed anxiety and depressed mood: Secondary | ICD-10-CM | POA: Diagnosis not present

## 2018-04-06 DIAGNOSIS — F411 Generalized anxiety disorder: Secondary | ICD-10-CM | POA: Diagnosis not present

## 2018-04-06 DIAGNOSIS — F331 Major depressive disorder, recurrent, moderate: Secondary | ICD-10-CM | POA: Diagnosis not present

## 2018-04-13 ENCOUNTER — Ambulatory Visit (INDEPENDENT_AMBULATORY_CARE_PROVIDER_SITE_OTHER): Payer: BLUE CROSS/BLUE SHIELD | Admitting: Psychology

## 2018-04-13 DIAGNOSIS — F4323 Adjustment disorder with mixed anxiety and depressed mood: Secondary | ICD-10-CM

## 2018-04-27 ENCOUNTER — Ambulatory Visit (INDEPENDENT_AMBULATORY_CARE_PROVIDER_SITE_OTHER): Payer: BLUE CROSS/BLUE SHIELD | Admitting: Psychology

## 2018-04-27 DIAGNOSIS — F4323 Adjustment disorder with mixed anxiety and depressed mood: Secondary | ICD-10-CM

## 2018-05-11 ENCOUNTER — Ambulatory Visit (INDEPENDENT_AMBULATORY_CARE_PROVIDER_SITE_OTHER): Payer: BLUE CROSS/BLUE SHIELD | Admitting: Psychology

## 2018-05-11 DIAGNOSIS — F331 Major depressive disorder, recurrent, moderate: Secondary | ICD-10-CM

## 2018-05-11 DIAGNOSIS — F4323 Adjustment disorder with mixed anxiety and depressed mood: Secondary | ICD-10-CM

## 2018-05-25 ENCOUNTER — Ambulatory Visit (INDEPENDENT_AMBULATORY_CARE_PROVIDER_SITE_OTHER): Payer: BLUE CROSS/BLUE SHIELD | Admitting: Psychology

## 2018-05-25 DIAGNOSIS — F331 Major depressive disorder, recurrent, moderate: Secondary | ICD-10-CM

## 2018-05-25 DIAGNOSIS — F4323 Adjustment disorder with mixed anxiety and depressed mood: Secondary | ICD-10-CM | POA: Diagnosis not present

## 2018-06-01 DIAGNOSIS — F331 Major depressive disorder, recurrent, moderate: Secondary | ICD-10-CM | POA: Diagnosis not present

## 2018-06-01 DIAGNOSIS — F411 Generalized anxiety disorder: Secondary | ICD-10-CM | POA: Diagnosis not present

## 2018-06-08 ENCOUNTER — Ambulatory Visit (INDEPENDENT_AMBULATORY_CARE_PROVIDER_SITE_OTHER): Payer: BLUE CROSS/BLUE SHIELD | Admitting: Psychology

## 2018-06-08 DIAGNOSIS — F331 Major depressive disorder, recurrent, moderate: Secondary | ICD-10-CM

## 2018-06-08 DIAGNOSIS — F4323 Adjustment disorder with mixed anxiety and depressed mood: Secondary | ICD-10-CM | POA: Diagnosis not present

## 2018-06-22 ENCOUNTER — Ambulatory Visit (INDEPENDENT_AMBULATORY_CARE_PROVIDER_SITE_OTHER): Payer: BLUE CROSS/BLUE SHIELD | Admitting: Psychology

## 2018-06-22 DIAGNOSIS — F4323 Adjustment disorder with mixed anxiety and depressed mood: Secondary | ICD-10-CM

## 2018-06-22 DIAGNOSIS — F331 Major depressive disorder, recurrent, moderate: Secondary | ICD-10-CM

## 2018-07-06 ENCOUNTER — Ambulatory Visit (INDEPENDENT_AMBULATORY_CARE_PROVIDER_SITE_OTHER): Payer: BLUE CROSS/BLUE SHIELD | Admitting: Psychology

## 2018-07-06 DIAGNOSIS — F4323 Adjustment disorder with mixed anxiety and depressed mood: Secondary | ICD-10-CM

## 2018-07-06 DIAGNOSIS — F331 Major depressive disorder, recurrent, moderate: Secondary | ICD-10-CM

## 2018-08-03 ENCOUNTER — Ambulatory Visit: Payer: BLUE CROSS/BLUE SHIELD | Admitting: Psychology

## 2018-08-09 ENCOUNTER — Ambulatory Visit (INDEPENDENT_AMBULATORY_CARE_PROVIDER_SITE_OTHER): Payer: BLUE CROSS/BLUE SHIELD | Admitting: Psychology

## 2018-08-09 DIAGNOSIS — F4323 Adjustment disorder with mixed anxiety and depressed mood: Secondary | ICD-10-CM | POA: Diagnosis not present

## 2018-08-09 DIAGNOSIS — F330 Major depressive disorder, recurrent, mild: Secondary | ICD-10-CM

## 2018-08-17 ENCOUNTER — Ambulatory Visit (INDEPENDENT_AMBULATORY_CARE_PROVIDER_SITE_OTHER): Payer: BLUE CROSS/BLUE SHIELD | Admitting: Psychology

## 2018-08-17 DIAGNOSIS — F4323 Adjustment disorder with mixed anxiety and depressed mood: Secondary | ICD-10-CM | POA: Diagnosis not present

## 2018-08-17 DIAGNOSIS — F330 Major depressive disorder, recurrent, mild: Secondary | ICD-10-CM | POA: Diagnosis not present

## 2018-08-31 ENCOUNTER — Ambulatory Visit (INDEPENDENT_AMBULATORY_CARE_PROVIDER_SITE_OTHER): Payer: BLUE CROSS/BLUE SHIELD | Admitting: Psychology

## 2018-08-31 DIAGNOSIS — F330 Major depressive disorder, recurrent, mild: Secondary | ICD-10-CM | POA: Diagnosis not present

## 2018-08-31 DIAGNOSIS — F4323 Adjustment disorder with mixed anxiety and depressed mood: Secondary | ICD-10-CM

## 2018-09-14 ENCOUNTER — Ambulatory Visit (INDEPENDENT_AMBULATORY_CARE_PROVIDER_SITE_OTHER): Payer: BLUE CROSS/BLUE SHIELD | Admitting: Psychology

## 2018-09-14 DIAGNOSIS — F330 Major depressive disorder, recurrent, mild: Secondary | ICD-10-CM

## 2018-09-14 DIAGNOSIS — F4323 Adjustment disorder with mixed anxiety and depressed mood: Secondary | ICD-10-CM

## 2018-09-28 ENCOUNTER — Ambulatory Visit (INDEPENDENT_AMBULATORY_CARE_PROVIDER_SITE_OTHER): Payer: BLUE CROSS/BLUE SHIELD | Admitting: Psychology

## 2018-09-28 DIAGNOSIS — F331 Major depressive disorder, recurrent, moderate: Secondary | ICD-10-CM | POA: Diagnosis not present

## 2018-09-28 DIAGNOSIS — F4323 Adjustment disorder with mixed anxiety and depressed mood: Secondary | ICD-10-CM | POA: Diagnosis not present

## 2018-10-12 ENCOUNTER — Ambulatory Visit (INDEPENDENT_AMBULATORY_CARE_PROVIDER_SITE_OTHER): Payer: BLUE CROSS/BLUE SHIELD | Admitting: Psychology

## 2018-10-12 DIAGNOSIS — F4323 Adjustment disorder with mixed anxiety and depressed mood: Secondary | ICD-10-CM

## 2018-10-12 DIAGNOSIS — F330 Major depressive disorder, recurrent, mild: Secondary | ICD-10-CM | POA: Diagnosis not present

## 2018-10-26 ENCOUNTER — Ambulatory Visit (INDEPENDENT_AMBULATORY_CARE_PROVIDER_SITE_OTHER): Payer: BLUE CROSS/BLUE SHIELD | Admitting: Psychology

## 2018-10-26 DIAGNOSIS — F4323 Adjustment disorder with mixed anxiety and depressed mood: Secondary | ICD-10-CM

## 2018-10-26 DIAGNOSIS — F331 Major depressive disorder, recurrent, moderate: Secondary | ICD-10-CM | POA: Diagnosis not present

## 2018-11-09 ENCOUNTER — Ambulatory Visit (INDEPENDENT_AMBULATORY_CARE_PROVIDER_SITE_OTHER): Payer: BLUE CROSS/BLUE SHIELD | Admitting: Psychology

## 2018-11-09 DIAGNOSIS — F4323 Adjustment disorder with mixed anxiety and depressed mood: Secondary | ICD-10-CM

## 2018-11-09 DIAGNOSIS — F331 Major depressive disorder, recurrent, moderate: Secondary | ICD-10-CM | POA: Diagnosis not present

## 2018-11-23 ENCOUNTER — Ambulatory Visit (INDEPENDENT_AMBULATORY_CARE_PROVIDER_SITE_OTHER): Payer: BLUE CROSS/BLUE SHIELD | Admitting: Psychology

## 2018-11-23 DIAGNOSIS — F4323 Adjustment disorder with mixed anxiety and depressed mood: Secondary | ICD-10-CM

## 2018-11-23 DIAGNOSIS — F330 Major depressive disorder, recurrent, mild: Secondary | ICD-10-CM | POA: Diagnosis not present

## 2018-11-28 DIAGNOSIS — F331 Major depressive disorder, recurrent, moderate: Secondary | ICD-10-CM | POA: Diagnosis not present

## 2018-11-28 DIAGNOSIS — F410 Panic disorder [episodic paroxysmal anxiety] without agoraphobia: Secondary | ICD-10-CM | POA: Diagnosis not present

## 2018-12-20 ENCOUNTER — Ambulatory Visit (INDEPENDENT_AMBULATORY_CARE_PROVIDER_SITE_OTHER): Payer: BLUE CROSS/BLUE SHIELD | Admitting: Psychology

## 2018-12-20 DIAGNOSIS — F4323 Adjustment disorder with mixed anxiety and depressed mood: Secondary | ICD-10-CM | POA: Diagnosis not present

## 2018-12-20 DIAGNOSIS — F330 Major depressive disorder, recurrent, mild: Secondary | ICD-10-CM | POA: Diagnosis not present

## 2019-01-03 ENCOUNTER — Ambulatory Visit (INDEPENDENT_AMBULATORY_CARE_PROVIDER_SITE_OTHER): Payer: BLUE CROSS/BLUE SHIELD | Admitting: Psychology

## 2019-01-03 DIAGNOSIS — F4323 Adjustment disorder with mixed anxiety and depressed mood: Secondary | ICD-10-CM

## 2019-01-03 DIAGNOSIS — F330 Major depressive disorder, recurrent, mild: Secondary | ICD-10-CM | POA: Diagnosis not present

## 2019-01-18 ENCOUNTER — Ambulatory Visit (INDEPENDENT_AMBULATORY_CARE_PROVIDER_SITE_OTHER): Payer: BLUE CROSS/BLUE SHIELD | Admitting: Psychology

## 2019-01-18 DIAGNOSIS — F330 Major depressive disorder, recurrent, mild: Secondary | ICD-10-CM

## 2019-01-18 DIAGNOSIS — F4323 Adjustment disorder with mixed anxiety and depressed mood: Secondary | ICD-10-CM

## 2019-02-01 ENCOUNTER — Ambulatory Visit (INDEPENDENT_AMBULATORY_CARE_PROVIDER_SITE_OTHER): Payer: BLUE CROSS/BLUE SHIELD | Admitting: Psychology

## 2019-02-01 DIAGNOSIS — F4323 Adjustment disorder with mixed anxiety and depressed mood: Secondary | ICD-10-CM | POA: Diagnosis not present

## 2019-02-01 DIAGNOSIS — F330 Major depressive disorder, recurrent, mild: Secondary | ICD-10-CM | POA: Diagnosis not present

## 2019-02-15 ENCOUNTER — Other Ambulatory Visit: Payer: Self-pay | Admitting: Primary Care

## 2019-02-15 ENCOUNTER — Ambulatory Visit: Payer: BLUE CROSS/BLUE SHIELD | Admitting: Psychology

## 2019-02-15 DIAGNOSIS — E781 Pure hyperglyceridemia: Secondary | ICD-10-CM

## 2019-02-20 ENCOUNTER — Encounter: Payer: Self-pay | Admitting: Primary Care

## 2019-02-20 ENCOUNTER — Ambulatory Visit: Payer: BLUE CROSS/BLUE SHIELD | Admitting: Primary Care

## 2019-02-20 VITALS — BP 122/82 | HR 88 | Temp 98.0°F | Ht 65.0 in | Wt 222.5 lb

## 2019-02-20 DIAGNOSIS — F411 Generalized anxiety disorder: Secondary | ICD-10-CM

## 2019-02-20 DIAGNOSIS — J302 Other seasonal allergic rhinitis: Secondary | ICD-10-CM | POA: Diagnosis not present

## 2019-02-20 DIAGNOSIS — E781 Pure hyperglyceridemia: Secondary | ICD-10-CM

## 2019-02-20 DIAGNOSIS — R05 Cough: Secondary | ICD-10-CM

## 2019-02-20 DIAGNOSIS — R053 Chronic cough: Secondary | ICD-10-CM | POA: Insufficient documentation

## 2019-02-20 HISTORY — DX: Chronic cough: R05.3

## 2019-02-20 LAB — LIPID PANEL
CHOL/HDL RATIO: 4
Cholesterol: 121 mg/dL (ref 0–200)
HDL: 34.5 mg/dL — ABNORMAL LOW (ref >39.00)
NonHDL: 86.89
TRIGLYCERIDES: 246 mg/dL — AB (ref 0.0–149.0)
VLDL: 49.2 mg/dL — AB (ref 0.0–40.0)

## 2019-02-20 LAB — COMPREHENSIVE METABOLIC PANEL
ALT: 16 U/L (ref 0–35)
AST: 18 U/L (ref 0–37)
Albumin: 4.3 g/dL (ref 3.5–5.2)
Alkaline Phosphatase: 30 U/L — ABNORMAL LOW (ref 39–117)
BILIRUBIN TOTAL: 0.4 mg/dL (ref 0.2–1.2)
BUN: 13 mg/dL (ref 6–23)
CALCIUM: 9.7 mg/dL (ref 8.4–10.5)
CO2: 28 meq/L (ref 19–32)
CREATININE: 0.82 mg/dL (ref 0.40–1.20)
Chloride: 103 mEq/L (ref 96–112)
GFR: 75.84 mL/min (ref >60.00)
Glucose, Bld: 110 mg/dL — ABNORMAL HIGH (ref 70–99)
Potassium: 4.3 mEq/L (ref 3.5–5.1)
Sodium: 138 mEq/L (ref 135–145)
Total Protein: 7.1 g/dL (ref 6.0–8.3)

## 2019-02-20 LAB — LDL CHOLESTEROL, DIRECT: Direct LDL: 61 mg/dL

## 2019-02-20 MED ORDER — MONTELUKAST SODIUM 10 MG PO TABS: 10.0000 mg | ORAL_TABLET | Freq: Every day | ORAL | 0 refills | Status: DC

## 2019-02-20 NOTE — Progress Notes (Signed)
Subjective:    Patient ID: Alisha Wagner, female    DOB: 24-Dec-1975, 43 y.o.   MRN: [id number]  HPI  Alisha Wagner is a 43 year old female who presents today for follow up.  1) Hypertriglyceridemia: Currently managed on rosuvastatin 5 mg, Fish Oil 1000 mg twice daily with meals.   She is due for lipid panel today. She denies myalgias.   BP Readings from Last 3 Encounters:  02/20/19 122/82  12/01/17 112/70  05/04/17 118/74   2) GAD: Currently managed on fluoxetine 20 mg. She's been doing well on the fluoxetine at the current dose. She is followed by psychiatric nurse practitioner who prescribes her fluoxetine. She is seeing her therapist every other week, doing well.   3) Chronic Cough: Never smoker. Present for the last three years, intermittently. Worse around late February through mid May, the returns around November to January. She was evaluated at urgent care several years ago and was told that she had post nasal drip. She started Claritin at that point and noticed some improvement but continued to experience symptoms. Cough is predominantly at night, sometimes during the day. She describes a tickle to the throat, always feels post nasal drip. She's also tried Flonase, Zyrtec, Allegra, Xyzal and neti pot rinses without improvement. She denies esophageal burning, epigastric pain.   She did undergo PFT's in the past, not diagnosed with asthma but did have abnormal PFT's. She was managed on inhalers but didn't notice improvement so she discontinued.   Review of Systems  Constitutional: Negative for chills and fever.  Respiratory: Positive for cough. Negative for shortness of breath.   Cardiovascular: Negative for chest pain.  Gastrointestinal:       Denies GERD  Allergic/Immunologic: Positive for environmental allergies.       Past Medical History:  Diagnosis Date  . Anxiety    5-htp  . Breast screening, unspecified   . Cellulitis and abscess of trunk   . Contact dermatitis  and other eczema, due to unspecified cause   . Depression    5-htp  . Other and unspecified hyperlipidemia    diet control, no med  . PONV (postoperative nausea and vomiting)   . Screening for malignant neoplasm of the cervix   . Seasonal allergies      Social History   Socioeconomic History  . Marital status: Married    Spouse name: Not on file  . Number of children: Not on file  . Years of education: Not on file  . Highest education level: Not on file  Occupational History  . Not on file  Social Needs  . Financial resource strain: Not on file  . Food insecurity:    Worry: Not on file    Inability: Not on file  . Transportation needs:    Medical: Not on file    Non-medical: Not on file  Tobacco Use  . Smoking status: Never Smoker  . Smokeless tobacco: Never Used  Substance and Sexual Activity  . Alcohol use: Yes    Comment: occassionally  . Drug use: No  . Sexual activity: Yes    Birth control/protection: None  Lifestyle  . Physical activity:    Days per week: Not on file    Minutes per session: Not on file  . Stress: Not on file  Relationships  . Social connections:    Talks on phone: Not on file    Gets together: Not on file    Attends religious service: Not on  file    Active member of club or organization: Not on file    Attends meetings of clubs or organizations: Not on file    Relationship status: Not on file  . Intimate partner violence:    Fear of current or ex partner: Not on file    Emotionally abused: Not on file    Physically abused: Not on file    Forced sexual activity: Not on file  Other Topics Concern  . Not on file  Social History Narrative   No children.   Married.   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   Enjoys gardening, baking/cooking, Firefighter.    Past Surgical History:  Procedure Laterality Date  . HAND SURGERY     right  . MYOMECTOMY N/A 05/02/2015   Procedure: EXPLORATORY LAPAROTOMY,  MYOMECTOMY;   Surgeon: Servando Salina, MD;  Location: [facility];  Service: Gynecology;  Laterality: N/A;  . REFRACTIVE SURGERY  12/2014   bilateral lasik  . WISDOM TOOTH EXTRACTION      Family History  Problem Relation Age of Onset  . Muscular dystrophy Father   . Heart disease Father        s/p CABG  . Diabetes Mother   . Kidney disease Mother     No Known Allergies  Current Outpatient Medications on File Prior to Visit  Medication Sig Dispense Refill  . B Complex-Biotin-FA (B-COMPLEX PO) Take 1 tablet by mouth daily.     . Cholecalciferol 1000 UNITS capsule Take 1,000 Units by mouth daily.      . fish oil-omega-3 fatty acids 1000 MG capsule Take 2 g by mouth daily.      Marland Kitchen FLUoxetine (PROZAC) 20 MG capsule Take 20 mg by mouth daily.     . Lactobacillus (PROBIOTIC ACIDOPHILUS PO) Take by mouth.    . loratadine (CLARITIN) 10 MG tablet Take 10 mg by mouth daily.    . Multiple Vitamins-Minerals (MULTIVITAMIN PO) Take 1 tablet by mouth daily.    . rosuvastatin (CRESTOR) 5 MG tablet TAKE 1 TABLET BY MOUTH EVERY DAY IN THE EVENING 90 tablet 0   No current facility-administered medications on file prior to visit.     BP 122/82   Pulse 88   Temp 98 F (36.7 C) (Oral)   Ht 5\' 5"  (1.651 m)   Wt 222 lb 8 oz (100.9 kg)   LMP 01/23/2019   SpO2 94%   BMI 37.03 kg/m    Objective:   Physical Exam  Constitutional: She appears well-nourished.  Neck: Neck supple.  Cardiovascular: Normal rate and regular rhythm.  Respiratory: Effort normal and breath sounds normal. She has no wheezes.  Skin: Skin is warm and dry.  Psychiatric: She has a normal mood and affect.           Assessment & Plan:

## 2019-02-20 NOTE — Assessment & Plan Note (Signed)
No improvement with numerous OTC products.  Trial of Singulair sent to pharmacy.  She will update in a few weeks.

## 2019-02-20 NOTE — Assessment & Plan Note (Signed)
Doing well on fluoxetine, continue same. Following with psychiatry and therapy.

## 2019-02-20 NOTE — Assessment & Plan Note (Signed)
Repeat lipids pending. Continue Crestor and Fish Oil.Alisha Wagner

## 2019-02-20 NOTE — Assessment & Plan Note (Signed)
Suspect to be allergy related based off of HPI and exam. Could be some component of silent GERD during the night.  Start with Singulair nightly. Consider adding PPI temporarily if needed. She will update in a few weeks.

## 2019-02-20 NOTE — Patient Instructions (Signed)
Start montelukast 10 mg (Singulair) at night for allergies.   You can hold your loratadine (Claritin) for now.  Please update me in a few weeks regarding your cough.  Stop by the lab prior to leaving today. I will notify you of your results once received.   It was a pleasure to see you today!

## 2019-02-22 ENCOUNTER — Other Ambulatory Visit (INDEPENDENT_AMBULATORY_CARE_PROVIDER_SITE_OTHER): Payer: BLUE CROSS/BLUE SHIELD

## 2019-02-22 ENCOUNTER — Other Ambulatory Visit: Payer: Self-pay

## 2019-02-22 ENCOUNTER — Other Ambulatory Visit: Payer: Self-pay | Admitting: Primary Care

## 2019-02-22 DIAGNOSIS — R739 Hyperglycemia, unspecified: Secondary | ICD-10-CM

## 2019-02-22 LAB — POCT GLYCOSYLATED HEMOGLOBIN (HGB A1C): Hemoglobin A1C: 5.2 % (ref 4.0–5.6)

## 2019-03-01 ENCOUNTER — Ambulatory Visit (INDEPENDENT_AMBULATORY_CARE_PROVIDER_SITE_OTHER): Payer: BLUE CROSS/BLUE SHIELD | Admitting: Psychology

## 2019-03-01 ENCOUNTER — Other Ambulatory Visit: Payer: Self-pay

## 2019-03-01 DIAGNOSIS — F4323 Adjustment disorder with mixed anxiety and depressed mood: Secondary | ICD-10-CM | POA: Diagnosis not present

## 2019-03-01 DIAGNOSIS — F330 Major depressive disorder, recurrent, mild: Secondary | ICD-10-CM

## 2019-03-15 ENCOUNTER — Ambulatory Visit: Payer: BLUE CROSS/BLUE SHIELD | Admitting: Psychology

## 2019-03-29 ENCOUNTER — Ambulatory Visit (INDEPENDENT_AMBULATORY_CARE_PROVIDER_SITE_OTHER): Payer: BLUE CROSS/BLUE SHIELD | Admitting: Psychology

## 2019-03-29 DIAGNOSIS — F330 Major depressive disorder, recurrent, mild: Secondary | ICD-10-CM

## 2019-03-29 DIAGNOSIS — F4323 Adjustment disorder with mixed anxiety and depressed mood: Secondary | ICD-10-CM | POA: Diagnosis not present

## 2019-04-12 ENCOUNTER — Ambulatory Visit (INDEPENDENT_AMBULATORY_CARE_PROVIDER_SITE_OTHER): Payer: BLUE CROSS/BLUE SHIELD | Admitting: Psychology

## 2019-04-12 DIAGNOSIS — F4323 Adjustment disorder with mixed anxiety and depressed mood: Secondary | ICD-10-CM | POA: Diagnosis not present

## 2019-04-12 DIAGNOSIS — F330 Major depressive disorder, recurrent, mild: Secondary | ICD-10-CM | POA: Diagnosis not present

## 2019-04-26 ENCOUNTER — Ambulatory Visit (INDEPENDENT_AMBULATORY_CARE_PROVIDER_SITE_OTHER): Payer: BLUE CROSS/BLUE SHIELD | Admitting: Psychology

## 2019-04-26 DIAGNOSIS — F4323 Adjustment disorder with mixed anxiety and depressed mood: Secondary | ICD-10-CM | POA: Diagnosis not present

## 2019-04-26 DIAGNOSIS — F330 Major depressive disorder, recurrent, mild: Secondary | ICD-10-CM

## 2019-05-02 DIAGNOSIS — F411 Generalized anxiety disorder: Secondary | ICD-10-CM | POA: Diagnosis not present

## 2019-05-02 DIAGNOSIS — F331 Major depressive disorder, recurrent, moderate: Secondary | ICD-10-CM | POA: Diagnosis not present

## 2019-05-10 ENCOUNTER — Ambulatory Visit: Payer: BLUE CROSS/BLUE SHIELD | Admitting: Psychology

## 2019-05-12 ENCOUNTER — Other Ambulatory Visit: Payer: Self-pay | Admitting: Primary Care

## 2019-05-12 DIAGNOSIS — R05 Cough: Secondary | ICD-10-CM

## 2019-05-12 DIAGNOSIS — J302 Other seasonal allergic rhinitis: Secondary | ICD-10-CM

## 2019-05-12 DIAGNOSIS — E781 Pure hyperglyceridemia: Secondary | ICD-10-CM

## 2019-05-12 DIAGNOSIS — R053 Chronic cough: Secondary | ICD-10-CM

## 2019-05-24 ENCOUNTER — Ambulatory Visit (INDEPENDENT_AMBULATORY_CARE_PROVIDER_SITE_OTHER): Payer: BC Managed Care – PPO | Admitting: Psychology

## 2019-05-24 DIAGNOSIS — F330 Major depressive disorder, recurrent, mild: Secondary | ICD-10-CM

## 2019-05-24 DIAGNOSIS — F4323 Adjustment disorder with mixed anxiety and depressed mood: Secondary | ICD-10-CM

## 2019-06-21 ENCOUNTER — Ambulatory Visit: Payer: BLUE CROSS/BLUE SHIELD | Admitting: Psychology

## 2019-08-14 ENCOUNTER — Other Ambulatory Visit: Payer: Self-pay | Admitting: Primary Care

## 2019-08-14 DIAGNOSIS — R053 Chronic cough: Secondary | ICD-10-CM

## 2019-08-14 DIAGNOSIS — J302 Other seasonal allergic rhinitis: Secondary | ICD-10-CM

## 2019-08-14 DIAGNOSIS — R05 Cough: Secondary | ICD-10-CM

## 2019-09-05 ENCOUNTER — Ambulatory Visit (INDEPENDENT_AMBULATORY_CARE_PROVIDER_SITE_OTHER): Payer: BC Managed Care – PPO

## 2019-09-05 DIAGNOSIS — Z23 Encounter for immunization: Secondary | ICD-10-CM | POA: Diagnosis not present

## 2019-09-29 ENCOUNTER — Other Ambulatory Visit: Payer: Self-pay | Admitting: Primary Care

## 2019-09-29 DIAGNOSIS — Z20822 Contact with and (suspected) exposure to covid-19: Secondary | ICD-10-CM

## 2019-09-30 LAB — NOVEL CORONAVIRUS, NAA: SARS-CoV-2, NAA: NOT DETECTED

## 2019-10-26 ENCOUNTER — Ambulatory Visit (INDEPENDENT_AMBULATORY_CARE_PROVIDER_SITE_OTHER): Payer: BC Managed Care – PPO | Admitting: Adult Health

## 2019-10-26 ENCOUNTER — Other Ambulatory Visit: Payer: Self-pay

## 2019-10-26 ENCOUNTER — Encounter: Payer: Self-pay | Admitting: Adult Health

## 2019-10-26 DIAGNOSIS — F331 Major depressive disorder, recurrent, moderate: Secondary | ICD-10-CM

## 2019-10-26 DIAGNOSIS — F411 Generalized anxiety disorder: Secondary | ICD-10-CM

## 2019-10-26 MED ORDER — FLUOXETINE HCL 20 MG PO CAPS: 20.0000 mg | ORAL_CAPSULE | Freq: Every day | ORAL | 3 refills | Status: DC

## 2019-10-26 NOTE — Progress Notes (Signed)
Alisha Wagner [id number] 1976/02/11 43 y.o.  Subjective:   Patient ID:  Alisha Wagner is a 43 y.o. (DOB 14-Jul-1976) female.  Chief Complaint:  Chief Complaint  Patient presents with  . Depression  . Anxiety    HPI Renda R Regnier presents to the office today for follow-up of anxiety and depression.  Describes mood today as "ok". Pleasant. Mood symptoms - reports depression, anxiety, and irritability "at times". Having "mood instability" with Pandemic. Worried about mother in long term care assisted living. Stating "everything is manageable". Stable interest and motivation. Taking medications as prescribed.  Energy levels stable. Active, has a regular exercise routine. Works full-time - at home [employer]. Enjoys some usual interests and activities. Spending time with family - partner. Mother local. Appetite adequate. Weight stable. Sleeps well most nights. Averages 6 to 8 hours. Focus and concentration stable. Completing tasks. Managing aspects of household. Work going well - has a "new boss". Denies SI or HI. Denies AH or VH.  Review of Systems:  Review of Systems  Musculoskeletal: Negative for gait problem.  Neurological: Negative for tremors.  Psychiatric/Behavioral:       Please refer to HPI    Medications: I have reviewed the patient's current medications.  Current Outpatient Medications  Medication Sig Dispense Refill  . B Complex-Biotin-FA (B-COMPLEX PO) Take 1 tablet by mouth daily.     . Cholecalciferol 1000 UNITS capsule Take 1,000 Units by mouth daily.      . fish oil-omega-3 fatty acids 1000 MG capsule Take 2 g by mouth daily.      Marland Kitchen FLUoxetine (PROZAC) 20 MG capsule Take 20 mg by mouth daily.     . Lactobacillus (PROBIOTIC ACIDOPHILUS PO) Take by mouth.    . loratadine (CLARITIN) 10 MG tablet Take 10 mg by mouth daily.    . montelukast (SINGULAIR) 10 MG tablet TAKE 1 TABLET (10 MG TOTAL) BY MOUTH AT BEDTIME. FOR ALLERGIES. 90 tablet 0  . Multiple  Vitamins-Minerals (MULTIVITAMIN PO) Take 1 tablet by mouth daily.    . rosuvastatin (CRESTOR) 5 MG tablet TAKE 1 TABLET BY MOUTH EVERY DAY IN THE EVENING 90 tablet 1   No current facility-administered medications for this visit.     Medication Side Effects: None  Allergies: No Known Allergies  Past Medical History:  Diagnosis Date  . Anxiety    5-htp  . Breast screening, unspecified   . Cellulitis and abscess of trunk   . Contact dermatitis and other eczema, due to unspecified cause   . Depression    5-htp  . Other and unspecified hyperlipidemia    diet control, no med  . PONV (postoperative nausea and vomiting)   . Screening for malignant neoplasm of the cervix   . Seasonal allergies     Family History  Problem Relation Age of Onset  . Muscular dystrophy Father   . Heart disease Father        s/p CABG  . Diabetes Mother   . Kidney disease Mother     Social History   Socioeconomic History  . Marital status: Married    Spouse name: Not on file  . Number of children: Not on file  . Years of education: Not on file  . Highest education level: Not on file  Occupational History  . Not on file  Social Needs  . Financial resource strain: Not on file  . Food insecurity    Worry: Not on file    Inability: Not on  file  . Transportation needs    Medical: Not on file    Non-medical: Not on file  Tobacco Use  . Smoking status: Never Smoker  . Smokeless tobacco: Never Used  Substance and Sexual Activity  . Alcohol use: Yes    Comment: occassionally  . Drug use: No  . Sexual activity: Yes    Birth control/protection: None  Lifestyle  . Physical activity    Days per week: Not on file    Minutes per session: Not on file  . Stress: Not on file  Relationships  . Social Herbalist on phone: Not on file    Gets together: Not on file    Attends religious service: Not on file    Active member of club or organization: Not on file    Attends meetings of clubs  or organizations: Not on file    Relationship status: Not on file  . Intimate partner violence    Fear of current or ex partner: Not on file    Emotionally abused: Not on file    Physically abused: Not on file    Forced sexual activity: Not on file  Other Topics Concern  . Not on file  Social History Narrative   No children.   Married.   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   Enjoys gardening, baking/cooking, Firefighter.    Past Medical History, Surgical history, Social history, and Family history were reviewed and updated as appropriate.   Please see review of systems for further details on the patient's review from today.   Objective:   Physical Exam:  There were no vitals taken for this visit.  Physical Exam Constitutional:      General: She is not in acute distress.    Appearance: She is well-developed.  Musculoskeletal:        General: No deformity.  Neurological:     Mental Status: She is alert and oriented to person, place, and time.     Coordination: Coordination normal.  Psychiatric:        Attention and Perception: Attention and perception normal. She does not perceive auditory or visual hallucinations.        Mood and Affect: Mood normal. Mood is not anxious or depressed. Affect is not labile, blunt, angry or inappropriate.        Speech: Speech normal.        Behavior: Behavior normal.        Thought Content: Thought content normal. Thought content is not paranoid or delusional. Thought content does not include homicidal or suicidal ideation. Thought content does not include homicidal or suicidal plan.        Cognition and Memory: Cognition and memory normal.        Judgment: Judgment normal.     Comments: Insight intact     Lab Review:     Component Value Date/Time   NA 138 02/20/2019 1118   K 4.3 02/20/2019 1118   CL 103 02/20/2019 1118   CO2 28 02/20/2019 1118   GLUCOSE 110 (H) 02/20/2019 1118   BUN 13 02/20/2019 1118    CREATININE 0.82 02/20/2019 1118   CALCIUM 9.7 02/20/2019 1118   PROT 7.1 02/20/2019 1118   ALBUMIN 4.3 02/20/2019 1118   AST 18 02/20/2019 1118   ALT 16 02/20/2019 1118   ALKPHOS 30 (L) 02/20/2019 1118   BILITOT 0.4 02/20/2019 1118       Component Value Date/Time  WBC 8.4 03/09/2016 1126   RBC 4.55 03/09/2016 1126   HGB 14.3 03/09/2016 1126   HCT 41.8 03/09/2016 1126   PLT 269.0 03/09/2016 1126   MCV 91.8 03/09/2016 1126   MCH 32.1 05/03/2015 0536   MCHC 34.3 03/09/2016 1126   RDW 12.7 03/09/2016 1126   LYMPHSABS 2.6 03/09/2016 1126   MONOABS 0.7 03/09/2016 1126   EOSABS 0.3 03/09/2016 1126   BASOSABS 0.0 03/09/2016 1126    No results found for: POCLITH, LITHIUM   No results found for: PHENYTOIN, PHENOBARB, VALPROATE, CBMZ   .res Assessment: Plan:    Plan:  1. Continue Prozac 20mg  daily  RTC 6 months to a year.   Patient advised to contact office with any questions, adverse effects, or acute worsening in signs and symptoms.  Jamiah was seen today for depression and anxiety.  Diagnoses and all orders for this visit:  Major depressive disorder, recurrent episode, moderate (HCC)  Generalized anxiety disorder    Please see After Visit Summary for patient specific instructions.  No future appointments.  No orders of the defined types were placed in this encounter.   -------------------------------

## 2019-10-26 NOTE — Telephone Encounter (Signed)
Patient needs virtual or in office visit to discuss. We've not every talked about her hemorrhoids before. Thanks!

## 2019-10-27 NOTE — Telephone Encounter (Signed)
Per DPR, left detail message of Alisha Wagner's comments for patient to call back 

## 2019-10-30 ENCOUNTER — Other Ambulatory Visit: Payer: Self-pay

## 2019-10-30 ENCOUNTER — Encounter: Payer: Self-pay | Admitting: Primary Care

## 2019-10-30 ENCOUNTER — Ambulatory Visit (INDEPENDENT_AMBULATORY_CARE_PROVIDER_SITE_OTHER): Payer: BC Managed Care – PPO | Admitting: Primary Care

## 2019-10-30 VITALS — BP 118/58 | Temp 98.0°F | Ht 65.0 in | Wt 208.0 lb

## 2019-10-30 DIAGNOSIS — K649 Unspecified hemorrhoids: Secondary | ICD-10-CM | POA: Diagnosis not present

## 2019-10-30 HISTORY — DX: Unspecified hemorrhoids: K64.9

## 2019-10-30 NOTE — Progress Notes (Signed)
Subjective:    Patient ID: Alisha Wagner, female    DOB: 05/25/1976, 43 y.o.   MRN: [id number]  HPI  Virtual Visit via Video Note  I connected with Alisha Wagner on 10/30/19 at  7:40 AM EST by a video enabled telemedicine application and verified that I am speaking with the correct person using two identifiers.  Location: Patient: Home Provider: Office   I discussed the limitations of evaluation and management by telemedicine and the availability of in person appointments. The patient expressed understanding and agreed to proceed.  History of Present Illness:  Alisha Wagner is a 43 year old female with a history of hemorrhoids who would like a referral to GI for further evaluation and treatment.  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 Over the years she'll notice symptoms of rectal pain each time she "eats the wrong food" which includes any sort of constipating foods. About two weeks ago she started the Keto diet which consisted of a diet high in fat and protein, since beginning that diet she's had several bouts of constipation that will cause hemorrhoidal pain and mild bleeding. Typically she'll apply witch hazel or preparation H which will typically help to reduce/aleviate symptoms but she's had little relief this time around.   She has never undergone GI evaluation or treatment for recurrent hemorrhoids. She endorses daily bowel movements. She has no problem with hemorrhoids as long as she's not constipated. Little bright red blood loss overall.    Observations/Objective:  Alert and oriented. Appears well, not sickly. No distress. Speaking in complete sentences.   Assessment and Plan:  Chronic, recent flare very uncomfortable. Discussed proper diet with fiber and water. Referral placed to GI for evaluation. Continue preparation H, witch hazel, ice packs, ibuprofen PRN. Offered Rx strength cream, she kindly declines and will update.  Follow Up  Instructions:  You will be contacted regarding your referral to GI.  Please let us know if you have not been contacted within two weeks.   It was a pleasure to see you today! Allie Bossier, NP-C    I discussed the assessment and treatment plan with the patient. The patient was provided an opportunity to ask questions and all were answered. The patient agreed with the plan and demonstrated an understanding of the instructions.   The patient was advised to call back or seek an in-person evaluation if the symptoms worsen or if the condition fails to improve as anticipated.   Pleas Koch, NP    Review of Systems  Constitutional: Negative for fever.  Gastrointestinal:       Hemorrhoids, see HPI       Past Medical History:  Diagnosis Date  . Anxiety    5-htp  . Breast screening, unspecified   . Cellulitis and abscess of trunk   . Contact dermatitis and other eczema, due to unspecified cause   . Depression    5-htp  . Hemorrhoids   . Other and unspecified hyperlipidemia    diet control, no med  . PONV (postoperative nausea and vomiting)   . Screening for malignant neoplasm of the cervix   . Seasonal allergies      Social History   Socioeconomic History  . Marital status: Married    Spouse name: Not on file  . Number of children: Not on file  . Years of education: Not on file  . Highest education level: Not on file  Occupational History  .  Not on file  Social Needs  . Financial resource strain: Not on file  . Food insecurity    Worry: Not on file    Inability: Not on file  . Transportation needs    Medical: Not on file    Non-medical: Not on file  Tobacco Use  . Smoking status: Never Smoker  . Smokeless tobacco: Never Used  Substance and Sexual Activity  . Alcohol use: Yes    Comment: occassionally  . Drug use: No  . Sexual activity: Yes    Birth control/protection: None  Lifestyle  . Physical activity    Days per week: Not on file    Minutes per  session: Not on file  . Stress: Not on file  Relationships  . Social Herbalist on phone: Not on file    Gets together: Not on file    Attends religious service: Not on file    Active member of club or organization: Not on file    Attends meetings of clubs or organizations: Not on file    Relationship status: Not on file  . Intimate partner violence    Fear of current or ex partner: Not on file    Emotionally abused: Not on file    Physically abused: Not on file    Forced sexual activity: Not on file  Other Topics Concern  . Not on file  Social History Narrative   No children.   Married.   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   Enjoys gardening, baking/cooking, Firefighter.    Past Surgical History:  Procedure Laterality Date  . HAND SURGERY     right  . MYOMECTOMY N/A 05/02/2015   Procedure: EXPLORATORY LAPAROTOMY,  MYOMECTOMY;  Surgeon: Servando Salina, MD;  Location: [facility];  Service: Gynecology;  Laterality: N/A;  . REFRACTIVE SURGERY  12/2014   bilateral lasik  . WISDOM TOOTH EXTRACTION      Family History  Problem Relation Age of Onset  . Muscular dystrophy Father   . Heart disease Father        s/p CABG  . Diabetes Mother   . Kidney disease Mother     No Known Allergies  Current Outpatient Medications on File Prior to Visit  Medication Sig Dispense Refill  . B Complex-Biotin-FA (B-COMPLEX PO) Take 1 tablet by mouth daily.     . Cholecalciferol 1000 UNITS capsule Take 1,000 Units by mouth daily.      . fish oil-omega-3 fatty acids 1000 MG capsule Take 2 g by mouth 2 (two) times daily.     Marland Kitchen FLUoxetine (PROZAC) 20 MG capsule Take 1 capsule (20 mg total) by mouth daily. 90 capsule 3  . Lactobacillus (PROBIOTIC ACIDOPHILUS PO) Take by mouth.    . levocetirizine (XYZAL) 5 MG tablet Take 5 mg by mouth every evening.    . Multiple Vitamins-Minerals (MULTIVITAMIN PO) Take 1 tablet by mouth daily.    . rosuvastatin (CRESTOR) 5 MG  tablet TAKE 1 TABLET BY MOUTH EVERY DAY IN THE EVENING 90 tablet 1  . loratadine (CLARITIN) 10 MG tablet Take 10 mg by mouth daily.    . montelukast (SINGULAIR) 10 MG tablet TAKE 1 TABLET (10 MG TOTAL) BY MOUTH AT BEDTIME. FOR ALLERGIES. (Patient not taking: Reported on 10/30/2019) 90 tablet 0   No current facility-administered medications on file prior to visit.     BP (!) 118/58   Temp 98 F (36.7 C) (Oral)  Ht 5\' 5"  (1.651 m)   Wt 208 lb (94.3 kg)   LMP 10/17/2019   BMI 34.61 kg/m    Objective:   Physical Exam  Constitutional: She is oriented to person, place, and time. She appears well-nourished.  Respiratory: Effort normal.  Neurological: She is alert and oriented to person, place, and time.  Psychiatric: She has a normal mood and affect.           Assessment & Plan:

## 2019-10-30 NOTE — Patient Instructions (Signed)
You will be contacted regarding your referral to GI.  Please let us know if you have not been contacted within two weeks.   It was a pleasure to see you today! Allie Bossier, NP-C   Hemorrhoids Hemorrhoids are swollen veins in and around the rectum or anus. There are two types of hemorrhoids:  Internal hemorrhoids. These occur in the veins that are just inside the rectum. They may poke through to the outside and become irritated and painful.  External hemorrhoids. These occur in the veins that are outside the anus and can be felt as a painful swelling or hard lump near the anus. Most hemorrhoids do not cause serious problems, and they can be managed with home treatments such as diet and lifestyle changes. If home treatments do not help the symptoms, procedures can be done to shrink or remove the hemorrhoids. What are the causes? This condition is caused by increased pressure in the anal area. This pressure may result from various things, including:  Constipation.  Straining to have a bowel movement.  Diarrhea.  Pregnancy.  Obesity.  Sitting for long periods of time.  Heavy lifting or other activity that causes you to strain.  Anal sex.  Riding a bike for a long period of time. What are the signs or symptoms? Symptoms of this condition include:  Pain.  Anal itching or irritation.  Rectal bleeding.  Leakage of stool (feces).  Anal swelling.  One or more lumps around the anus. How is this diagnosed? This condition can often be diagnosed through a visual exam. Other exams or tests may also be done, such as:  An exam that involves feeling the rectal area with a gloved hand (digital rectal exam).  An exam of the anal canal that is done using a small tube (anoscope).  A blood test, if you have lost a significant amount of blood.  A test to look inside the colon using a flexible tube with a camera on the end (sigmoidoscopy or colonoscopy). How is this treated? This  condition can usually be treated at home. However, various procedures may be done if dietary changes, lifestyle changes, and other home treatments do not help your symptoms. These procedures can help make the hemorrhoids smaller or remove them completely. Some of these procedures involve surgery, and others do not. Common procedures include:  Rubber band ligation. Rubber bands are placed at the base of the hemorrhoids to cut off their blood supply.  Sclerotherapy. Medicine is injected into the hemorrhoids to shrink them.  Infrared coagulation. A type of light energy is used to get rid of the hemorrhoids.  Hemorrhoidectomy surgery. The hemorrhoids are surgically removed, and the veins that supply them are tied off.  Stapled hemorrhoidopexy surgery. The surgeon staples the base of the hemorrhoid to the rectal wall. Follow these instructions at home: Eating and drinking   Eat foods that have a lot of fiber in them, such as whole grains, beans, nuts, fruits, and vegetables.  Ask your health care provider about taking products that have added fiber (fiber supplements).  Reduce the amount of fat in your diet. You can do this by eating low-fat dairy products, eating less red meat, and avoiding processed foods.  Drink enough fluid to keep your urine pale yellow. Managing pain and swelling   Take warm sitz baths for 20 minutes, 3-4 times a day to ease pain and discomfort. You may do this in a bathtub or using a portable sitz bath that fits over the  toilet.  If directed, apply ice to the affected area. Using ice packs between sitz baths may be helpful. ? Put ice in a plastic bag. ? Place a towel between your skin and the bag. ? Leave the ice on for 20 minutes, 2-3 times a day. General instructions  Take over-the-counter and prescription medicines only as told by your health care provider.  Use medicated creams or suppositories as told.  Get regular exercise. Ask your health care provider  how much and what kind of exercise is best for you. In general, you should do moderate exercise for at least 30 minutes on most days of the week (150 minutes each week). This can include activities such as walking, biking, or yoga.  Go to the bathroom when you have the urge to have a bowel movement. Do not wait.  Avoid straining to have bowel movements.  Keep the anal area dry and clean. Use wet toilet paper or moist towelettes after a bowel movement.  Do not sit on the toilet for long periods of time. This increases blood pooling and pain.  Keep all follow-up visits as told by your health care provider. This is important. Contact a health care provider if you have:  Increasing pain and swelling that are not controlled by treatment or medicine.  Difficulty having a bowel movement, or you are unable to have a bowel movement.  Pain or inflammation outside the area of the hemorrhoids. Get help right away if you have:  Uncontrolled bleeding from your rectum. Summary  Hemorrhoids are swollen veins in and around the rectum or anus.  Most hemorrhoids can be managed with home treatments such as diet and lifestyle changes.  Taking warm sitz baths can help ease pain and discomfort.  In severe cases, procedures or surgery can be done to shrink or remove the hemorrhoids. This information is not intended to replace advice given to you by your health care provider. Make sure you discuss any questions you have with your health care provider. Document Released: 11/27/2000 Document Revised: 12/08/2018 Document Reviewed: 04/21/2018 Elsevier Patient Education  2020 [employer].

## 2019-10-30 NOTE — Assessment & Plan Note (Signed)
Chronic, recent flare very uncomfortable. Discussed proper diet with fiber and water. Referral placed to GI for evaluation. Continue preparation H, witch hazel, ice packs, ibuprofen PRN. Offered Rx strength cream, she kindly declines and will update.

## 2019-11-14 ENCOUNTER — Other Ambulatory Visit: Payer: Self-pay | Admitting: Primary Care

## 2019-11-14 DIAGNOSIS — E781 Pure hyperglyceridemia: Secondary | ICD-10-CM

## 2019-11-15 ENCOUNTER — Encounter: Payer: Self-pay | Admitting: Gastroenterology

## 2019-11-15 ENCOUNTER — Telehealth: Payer: Self-pay

## 2019-11-15 ENCOUNTER — Other Ambulatory Visit: Payer: Self-pay

## 2019-11-15 ENCOUNTER — Ambulatory Visit: Payer: BC Managed Care – PPO | Admitting: Gastroenterology

## 2019-11-15 VITALS — BP 132/80 | HR 68 | Temp 97.2°F | Ht 65.0 in | Wt 215.6 lb

## 2019-11-15 DIAGNOSIS — K6289 Other specified diseases of anus and rectum: Secondary | ICD-10-CM

## 2019-11-15 DIAGNOSIS — K5909 Other constipation: Secondary | ICD-10-CM | POA: Diagnosis not present

## 2019-11-15 DIAGNOSIS — K644 Residual hemorrhoidal skin tags: Secondary | ICD-10-CM

## 2019-11-15 NOTE — Patient Instructions (Signed)
If you are age 44 or older, your body mass index should be between 23-30. Your Body mass index is 35.88 kg/m. If this is out of the aforementioned range listed, please consider follow up with your Primary Care Provider.  If you are age 33 or younger, your body mass index should be between 19-25. Your Body mass index is 35.88 kg/m. If this is out of the aformentioned range listed, please consider follow up with your Primary Care Provider.   We will send your records to [facility]. Please arrive 15 minutes early for registration. Make certain to bring a list of current medications, including any over the counter medications or vitamins. Also bring your co-pay if you have one as well as your insurance cards. [facility] is located at [address], [address]. Should you need to reschedule your appointment, please contact them at [phone].  It was a pleasure to see you today!  Dr. Loletha Carrow

## 2019-11-15 NOTE — Progress Notes (Signed)
[facility] Consult Note:  HistoryTRINNIE Wagner 11/15/2019  Referring provider: Pleas Koch, NP  Reason for consult/chief complaint: Hemorrhoids (No bleeding presently, patient complains of itching, burning and pain)   Subjective  HPI:  Recent primary care visit with patient reporting longstanding issues of intermittent rectal pain and bleeding, typically during periods of constipation.  This has apparently been dating back to childhood, and she has not had previous GI evaluation.  Alisha Wagner reports constipation and problems with intermittent rectal pain and bleeding dating back to childhood.  It persisted throughout her 63s, and she always felt it was likely hemorrhoidal from some reading she did.  As long as she was good about her diet with plenty of fiber and water to avoid constipation, the problem was generally under control.  She went on a keto diet earlier this year and got terribly constipated without fiber, and had anorectal pain and swelling with some occasional small-volume bleeding.  It was at its worst several weeks ago where it was very difficult to sit due to the pain and swelling until it finally subsided somewhat her bowel habits got more regular. She denies chronic abdominal pain, dysphagia, odynophagia, nausea vomiting early satiety or unanticipated weight loss.  ROS:  Review of Systems  Constitutional: Negative for appetite change and unexpected weight change.  HENT: Negative for mouth sores and voice change.   Eyes: Negative for pain and redness.  Respiratory: Negative for cough and shortness of breath.   Cardiovascular: Negative for chest pain and palpitations.  Genitourinary: Negative for dysuria and hematuria.  Musculoskeletal: Negative for arthralgias and myalgias.  Skin: Negative for pallor and rash.  Neurological: Negative for weakness and headaches.  Hematological: Negative for adenopathy.     Past Medical History: Past  Medical History:  Diagnosis Date  . Anxiety    5-htp  . Breast screening, unspecified   . Cellulitis and abscess of trunk   . Contact dermatitis and other eczema, due to unspecified cause   . Depression    5-htp  . Hemorrhoids   . Other and unspecified hyperlipidemia    diet control, no med  . PONV (postoperative nausea and vomiting)   . Screening for malignant neoplasm of the cervix   . Seasonal allergies      Past Surgical History: Past Surgical History:  Procedure Laterality Date  . HAND SURGERY     right  . MYOMECTOMY N/A 05/02/2015   Procedure: EXPLORATORY LAPAROTOMY,  MYOMECTOMY;  Surgeon: Servando Salina, MD;  Location: [facility];  Service: Gynecology;  Laterality: N/A;  . REFRACTIVE SURGERY  12/2014   bilateral lasik  . WISDOM TOOTH EXTRACTION       Family History: Family History  Problem Relation Age of Onset  . Muscular dystrophy Father   . Heart disease Father        s/p CABG  . Diabetes Mother   . Kidney disease Mother     Social History: Social History   Socioeconomic History  . Marital status: Married    Spouse name: Not on file  . Number of children: Not on file  . Years of education: Not on file  . Highest education level: Not on file  Occupational History  . Not on file  Social Needs  . Financial resource strain: Not on file  . Food insecurity    Worry: Not on file    Inability: Not on file  . Transportation needs    Medical: Not on file  Non-medical: Not on file  Tobacco Use  . Smoking status: Never Smoker  . Smokeless tobacco: Never Used  Substance and Sexual Activity  . Alcohol use: Yes    Comment: occassionally  . Drug use: No  . Sexual activity: Yes    Birth control/protection: None  Lifestyle  . Physical activity    Days per week: Not on file    Minutes per session: Not on file  . Stress: Not on file  Relationships  . Social Herbalist on phone: Not on file    Gets together: Not on file    Attends religious  service: Not on file    Active member of club or organization: Not on file    Attends meetings of clubs or organizations: Not on file    Relationship status: Not on file  Other Topics Concern  . Not on file  Social History Narrative   No children.   Married.   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   Enjoys gardening, baking/cooking, Firefighter.    Allergies: No Known Allergies  Outpatient Meds: Current Outpatient Medications  Medication Sig Dispense Refill  . B Complex-Biotin-FA (B-COMPLEX PO) Take 1 tablet by mouth daily.     . fish oil-omega-3 fatty acids 1000 MG capsule Take 2 g by mouth 2 (two) times daily.     Marland Kitchen FLUoxetine (PROZAC) 20 MG capsule Take 1 capsule (20 mg total) by mouth daily. 90 capsule 3  . Lactobacillus (PROBIOTIC ACIDOPHILUS PO) Take by mouth.    . levocetirizine (XYZAL) 5 MG tablet Take 5 mg by mouth every evening.    . montelukast (SINGULAIR) 10 MG tablet TAKE 1 TABLET (10 MG TOTAL) BY MOUTH AT BEDTIME. FOR ALLERGIES. 90 tablet 0  . Multiple Vitamins-Minerals (MULTIVITAMIN PO) Take 1 tablet by mouth daily.    . rosuvastatin (CRESTOR) 5 MG tablet TAKE 1 TABLET BY MOUTH EVERY DAY IN THE EVENING 90 tablet 1   No current facility-administered medications for this visit.       ___________________________________________________________________ Objective   Exam:  BP 132/80   Pulse 68   Temp (!) 97.2 F (36.2 C)   Ht 5\' 5"  (1.651 m)   Wt 215 lb 9.6 oz (97.8 kg)   LMP 11/08/2019 (Approximate)   BMI 35.88 kg/m  Entire physical exam accompanied by our MA, June  General: Well-appearing  Eyes: sclera anicteric, no redness  ENT: oral mucosa moist without lesions, no cervical or supraclavicular lymphadenopathy  CV: RRR without murmur, S1/S2, no JVD, no peripheral edema  Resp: clear to auscultation bilaterally, normal RR and effort noted  GI: soft, no tenderness, with active bowel sounds. No guarding or palpable organomegaly  noted.  Skin; warm and dry, no rash or jaundice noted  Neuro: awake, alert and oriented x 3. Normal gross motor function and fluent speech Rectal: Large anterior skin tag or deflated external hemorrhoid, longest about 2 cm with an erythematous ball-like tip that may be from recent rupture or thrombosis. DRE without fissure, tenderness stricture or palpable internal lesions  Labs:  CBC Latest Ref Rng & Units 03/09/2016 05/03/2015 04/30/2015  WBC 4.0 - 10.5 K/uL 8.4 12.1(H) 7.4  Hemoglobin 12.0 - 15.0 g/dL 14.3 12.1 15.2(H)  Hematocrit 36.0 - 46.0 % 41.8 34.6(L) 42.4  Platelets 150.0 - 400.0 K/uL 269.0 151 191    Assessment: Encounter Diagnoses  Name Primary?  Marland Kitchen Anal pain Yes  . External hemorrhoids   . Chronic  constipation     Symptomatic external hemorrhoids, exacerbated during periods of constipation.  No fissure, no chronic abdominal pain, no fistula to suggest Crohn's disease.  Plan:  Continue high-fiber diet, adequate water intake to avoid constipation.  Low-dose MiraLAX could be added if needed.  Colorectal surgery evaluation, as I think she most likely needs surgical excision of this external hemorrhoidal tissue.  Even when it is not acutely symptomatic, it is still uncomfortable, causes local symptoms of pain burning and itching and difficulty with toilet hygiene.  Thank you for the courtesy of this consult.  Please call me with any questions or concerns.  Nelida Meuse III  CC: Referring provider noted above

## 2019-11-15 NOTE — Telephone Encounter (Signed)
[facility] referral has been faxed. Will await appointment information.

## 2019-11-29 NOTE — Telephone Encounter (Signed)
Scheduled with Dr Dema Severin on 12-04-2019 @ 1045am

## 2019-12-04 DIAGNOSIS — K649 Unspecified hemorrhoids: Secondary | ICD-10-CM | POA: Diagnosis not present

## 2020-01-09 ENCOUNTER — Ambulatory Visit: Payer: Self-pay | Admitting: Surgery

## 2020-01-09 NOTE — H&P (Signed)
CC: Referred by Dr. Loletha Carrow for possible hemorrhoids  HPI: Alisha Wagner is a very pleasant 44yoF with hx of hypertriglyceridemia, seasonal allergies, who reports a near lifelong history of occasional intermittent bright red blood per rectum and prolapsing anal tissue. These have become more problematic in the last month. 3 weeks ago, she struggled with some issues regarding constipation due to dietary changes-Keto diet. With this, she had painful swollen perianal tissue that has been now subsiding improving since she has gone back on a high-fiber diet. She does not currently take a fiber supplement. She estimates that she drinks 6-9 cups of water per day. Currently, having 1 soft bowel movement per day but recently did struggle with some constipation. She spends approximately 2-3 minutes on the commode with a bowel movement.  She has never had a colonoscopy but is following with Dr. Loletha Carrow  PMH: hypertriglyceridemia (well controlled with statin), seasonal allergies (well controlled with prn antihistamines)  PSH: Denies any prior anorectal procedures or surgeries  FHx: Denies FHx of malignancy  Social: Denies use of tobacco/drugs; 4-5 drinks of EtOH per week; works as a Radiographer, therapeutic  ROS: A comprehensive 10 system review of systems was completed with the patient and pertinent findings as noted above.  The patient is a 44 year old female.   Past Surgical History Alisha Wagner, Alisha Wagner; 12/04/2019 10:37 AM) Oral Surgery   Diagnostic Studies History (Alisha Wagner, Alisha Wagner; 12/04/2019 10:37 AM) Colonoscopy  never Mammogram  >3 years ago Pap Smear  >5 years ago  Allergies Alisha Wagner, Wagner; 12/04/2019 10:41 AM) No Known Allergies [12/04/2019]:  Medication History (Alisha Wagner, Wagner; 12/04/2019 10:43 AM) Rosuvastatin Calcium (5MG  Tablet, Oral) Active. Montelukast Sodium (10MG  Tablet, Oral) Active. FLUoxetine HCl (20MG  Capsule, Oral) Active. Levocetirizine Dihydrochloride (5MG  Tablet, Oral)  Active. Multiple Vitamins (Oral) Active. Medications Reconciled  Social History Alisha Wagner, Wagner; 12/04/2019 10:37 AM) Alcohol use  Moderate alcohol use. Caffeine use  Coffee. No drug use  Tobacco use  Never smoker.  Family History Alisha Wagner, [place]; 12/04/2019 10:37 AM) Arthritis  Mother. Depression  Brother, Mother. Diabetes Mellitus  Mother. Heart Disease  Father. Hypertension  Mother. Kidney Disease  Mother. Migraine Headache  Mother.  Pregnancy / Birth History Alisha Wagner, [place]; 12/04/2019 10:37 AM) Age at menarche  37 years. Gravida  0 Para  0 Regular periods   Other Problems (Alisha Wagner, Wagner; 12/04/2019 10:37 AM) Anxiety Disorder  Depression  General anesthesia - complications  Hemorrhoids     Review of Systems (Alisha Wagner; 12/04/2019 10:37 AM) General Not Present- Appetite Loss, Chills, Fatigue, Fever, Night Sweats, Weight Gain and Weight Loss. Skin Not Present- Change in Wart/Mole, Dryness, Hives, Jaundice, Alisha Lesions, Non-Healing Wounds, Rash and Ulcer. HEENT Present- Seasonal Allergies. Not Present- Earache, Hearing Loss, Hoarseness, Nose Bleed, Oral Ulcers, Ringing in the Ears, Sinus Pain, Sore Throat, Visual Disturbances, Wears glasses/contact lenses and Yellow Eyes. Respiratory Not Present- Bloody sputum, Chronic Cough, Difficulty Breathing, Snoring and Wheezing. Breast Not Present- Breast Mass, Breast Pain, Nipple Discharge and Skin Changes. Cardiovascular Not Present- Chest Pain, Difficulty Breathing Lying Down, Leg Cramps, Palpitations, Rapid Heart Rate, Shortness of Breath and Swelling of Extremities. Gastrointestinal Present- Hemorrhoids. Not Present- Abdominal Pain, Bloating, Bloody Stool, Change in Bowel Habits, Chronic diarrhea, Constipation, Difficulty Swallowing, Excessive gas, Gets full quickly at meals, Indigestion, Nausea, Rectal Pain and Vomiting. Female Genitourinary Not Present- Frequency, Nocturia, Painful  Urination, Pelvic Pain and Urgency. Musculoskeletal Not Present- Back Pain, Joint Pain, Joint Stiffness, Muscle Pain, Muscle Weakness and Swelling  of Extremities. Neurological Not Present- Decreased Memory, Fainting, Headaches, Numbness, Seizures, Tingling, Tremor, Trouble walking and Weakness. Psychiatric Not Present- Anxiety, Bipolar, Change in Sleep Pattern, Depression, Fearful and Frequent crying. Endocrine Not Present- Cold Intolerance, Excessive Hunger, Hair Changes, Heat Intolerance, Hot flashes and Alisha Diabetes. Hematology Not Present- Blood Thinners, Easy Bruising, Excessive bleeding, Gland problems, HIV and Persistent Infections.  Vitals (Alisha Wagner; 12/04/2019 10:44 AM) 12/04/2019 10:43 AM Weight: 214.25 lb Height: 66in Body Surface Area: 2.06 m Body Mass Index: 34.58 kg/m  Temp.: 86F  Pulse: 101 (Regular)  BP: 130/72 (Sitting, Left Arm, Standard)       Physical Exam Alisha Gave M. Jasyn Mey MD; 12/04/2019 11:27 AM) The physical exam findings are as follows: Note:Constitutional: No acute distress; conversant; no deformities; wearing medical mask Eyes: Moist conjunctiva; no lid lag; anicteric sclerae; pupils equal and round Neck: Trachea midline; no palpable thyromegaly Lungs: Normal respiratory effort; no tactile fremitus CV: rrr; no palpable thrill; no pitting edema GI: Abdomen soft, nontender, nondistended; no palpable hepatosplenomegaly Anorectal: External tag/hemorrhoid ~2 cm long and now decompressed; no perianal skin changes; DRE - normal tone, no palpable masses Anoscopy: Circumferential anoscopy demonstrates small internal hemorrhoids; no ulcerations in the anal canal identified; no evidence of anal fissures. MSK: Normal gait; no clubbing/cyanosis Psychiatric: Appropriate affect; alert and oriented 3 Lymphatic: No palpable cervical or axillary lymphadenopathy **A chaperone, [employer], was present for this encounter    Assessment &  Plan Alisha Gave M. Eldean Klatt MD; 12/04/2019 11:42 AM) HEMORRHOID (K64.9) Story: [name] is a very pleasant 44yoF with hx of HLD, seasonal allergies - here with a large external/prolapsed tag is approximately 2 cm long but has a narrow base. Impression: -We discussed the anatomy and physiology of the GI tract and pathophysiology of hemorrhoids with associated illustrations. I recommended she begin taking a daily fiber supplement (Metamucil/Citrucel/FiberCon/Benefiber/Konsyl), drinks 64 ounces of water per day (8, 8 ounce glasses) and minimize time on commode 2-3 minutes. -With the above maneuvers, we did discuss that this large anatomic tag is likely to get any better and therefore could proceed with removal. She has requested this. -We discussed excision of external hemorrhoidal tag, examined under anesthesia. -The planned procedure, material risks (including, but not limited to, pain, bleeding, infection, scarring, need for blood transfusion, damage to anal sphincter, incontinence of gas and/or stool, need for additional procedures, recurrence of hemorrhoids, pneumonia, heart attack, stroke, death) benefits and alternatives to surgery were discussed at length. I noted a good probability that the procedure would help improve her symptoms and additionally with hygiene. The patient's questions were answered to her satisfaction, she voiced understanding and elected to proceed with surgery. Additionally, we discussed typical postoperative expectations and the recovery process. Current Plans ANOSCOPY, DIAGNOSTIC [phone]) (Anorectal: External tag/hemorrhoid ~2 cm long and now decompressed; no perianal skin changes; DRE - normal tone, no palpable masses; Anoscopy: Circumferential anoscopy demonstrates small internal hemorrhoids; no ulcerations in the anal cana) Signed by Alisha Roup, MD (12/04/2019 11:42 AM)

## 2020-01-29 ENCOUNTER — Other Ambulatory Visit (HOSPITAL_COMMUNITY): Payer: BC Managed Care – PPO

## 2020-02-08 ENCOUNTER — Other Ambulatory Visit: Payer: Self-pay | Admitting: Primary Care

## 2020-02-09 ENCOUNTER — Telehealth: Payer: Self-pay | Admitting: Radiology

## 2020-02-09 NOTE — Telephone Encounter (Signed)
The patient has a lab appt for 3.3.21. Anda Kraft said she has no idea what she is coming in for, hasn't had a cpx with her. Please cancel her appt or let Anda Kraft know what the patient says if she calls back. LVM for the patient to call

## 2020-02-14 ENCOUNTER — Other Ambulatory Visit: Payer: Self-pay | Admitting: Primary Care

## 2020-02-14 ENCOUNTER — Other Ambulatory Visit (INDEPENDENT_AMBULATORY_CARE_PROVIDER_SITE_OTHER): Payer: BC Managed Care – PPO

## 2020-02-14 ENCOUNTER — Other Ambulatory Visit: Payer: Self-pay

## 2020-02-14 DIAGNOSIS — E781 Pure hyperglyceridemia: Secondary | ICD-10-CM

## 2020-02-14 LAB — LIPID PANEL
Cholesterol: 130 mg/dL (ref 0–200)
HDL: 36.1 mg/dL — ABNORMAL LOW (ref >39.00)
LDL Cholesterol: 64 mg/dL (ref 0–99)
NonHDL: 94.23
Total CHOL/HDL Ratio: 4
Triglycerides: 150 mg/dL — ABNORMAL HIGH (ref 0.0–149.0)
VLDL: 30 mg/dL (ref 0.0–40.0)

## 2020-02-14 LAB — COMPREHENSIVE METABOLIC PANEL
ALT: 19 U/L (ref 0–35)
AST: 21 U/L (ref 0–37)
Albumin: 4 g/dL (ref 3.5–5.2)
Alkaline Phosphatase: 27 U/L — ABNORMAL LOW (ref 39–117)
BUN: 15 mg/dL (ref 6–23)
CO2: 28 mEq/L (ref 19–32)
Calcium: 9.3 mg/dL (ref 8.4–10.5)
Chloride: 100 mEq/L (ref 96–112)
Creatinine, Ser: 0.79 mg/dL (ref 0.40–1.20)
GFR: 78.82 mL/min (ref >60.00)
Glucose, Bld: 90 mg/dL (ref 70–99)
Potassium: 4.3 mEq/L (ref 3.5–5.1)
Sodium: 135 mEq/L (ref 135–145)
Total Bilirubin: 0.4 mg/dL (ref 0.2–1.2)
Total Protein: 6.9 g/dL (ref 6.0–8.3)

## 2020-02-15 ENCOUNTER — Other Ambulatory Visit: Payer: Self-pay | Admitting: Primary Care

## 2020-02-15 DIAGNOSIS — E781 Pure hyperglyceridemia: Secondary | ICD-10-CM

## 2020-02-15 DIAGNOSIS — J302 Other seasonal allergic rhinitis: Secondary | ICD-10-CM

## 2020-02-15 DIAGNOSIS — R053 Chronic cough: Secondary | ICD-10-CM

## 2020-02-15 DIAGNOSIS — R05 Cough: Secondary | ICD-10-CM

## 2020-02-16 MED ORDER — MONTELUKAST SODIUM 10 MG PO TABS: 10.0000 mg | ORAL_TABLET | Freq: Every day | ORAL | 0 refills | Status: DC

## 2020-02-16 MED ORDER — ROSUVASTATIN CALCIUM 5 MG PO TABS: ORAL_TABLET | ORAL | 3 refills | Status: DC

## 2020-02-28 ENCOUNTER — Other Ambulatory Visit: Payer: Self-pay

## 2020-02-28 ENCOUNTER — Encounter (HOSPITAL_BASED_OUTPATIENT_CLINIC_OR_DEPARTMENT_OTHER): Payer: Self-pay | Admitting: Surgery

## 2020-02-28 NOTE — Progress Notes (Signed)
Spoke w/ via phone for pre-op interview--- PT Lab needs dos---- Urine preg              Lab results------ getting CBCdiff, CMP, PT/PTT done 03-04-2020 @ 1000 COVID test ------ 03-04-2020 @ 0930 Arrive at -------  0630 NPO after ------ MN Medications to take morning of surgery ----- NONE Diabetic medication ----- n/a Patient Special Instructions ----- will do one fleet enema night before and morning of surgery Pre-Op special Istructions ----- n/a Patient verbalized understanding of instructions that were given at this phone interview. Patient denies shortness of breath, chest pain, fever, cough a this phone interview.

## 2020-03-02 ENCOUNTER — Other Ambulatory Visit (HOSPITAL_COMMUNITY): Payer: Self-pay

## 2020-03-04 ENCOUNTER — Other Ambulatory Visit: Payer: Self-pay

## 2020-03-04 ENCOUNTER — Other Ambulatory Visit (HOSPITAL_COMMUNITY)
Admission: RE | Admit: 2020-03-04 | Discharge: 2020-03-04 | Disposition: A | Discharge status: Home / Self Care | Payer: BC Managed Care – PPO | Source: Ambulatory Visit | Attending: Surgery | Admitting: Surgery

## 2020-03-04 ENCOUNTER — Encounter (HOSPITAL_COMMUNITY)
Admission: RE | Admit: 2020-03-04 | Discharge: 2020-03-04 | Disposition: A | Discharge status: Home / Self Care | Payer: BC Managed Care – PPO | Source: Ambulatory Visit | Attending: Surgery | Admitting: Surgery

## 2020-03-04 DIAGNOSIS — Z01812 Encounter for preprocedural laboratory examination: Secondary | ICD-10-CM | POA: Insufficient documentation

## 2020-03-04 DIAGNOSIS — Z20822 Contact with and (suspected) exposure to covid-19: Secondary | ICD-10-CM | POA: Diagnosis not present

## 2020-03-04 LAB — CBC WITH DIFFERENTIAL/PLATELET
Abs Immature Granulocytes: 0.01 10*3/uL (ref 0.00–0.07)
Basophils Absolute: 0 10*3/uL (ref 0.0–0.1)
Basophils Relative: 1 %
Eosinophils Absolute: 0.2 10*3/uL (ref 0.0–0.5)
Eosinophils Relative: 3 %
HCT: 41.8 % (ref 36.0–46.0)
Hemoglobin: 14.1 g/dL (ref 12.0–15.0)
Immature Granulocytes: 0 %
Lymphocytes Relative: 33 %
Lymphs Abs: 2.2 10*3/uL (ref 0.7–4.0)
MCH: 31.8 pg (ref 26.0–34.0)
MCHC: 33.7 g/dL (ref 30.0–36.0)
MCV: 94.1 fL (ref 80.0–100.0)
Monocytes Absolute: 0.6 10*3/uL (ref 0.1–1.0)
Monocytes Relative: 9 %
Neutro Abs: 3.6 10*3/uL (ref 1.7–7.7)
Neutrophils Relative %: 54 %
Platelets: 228 10*3/uL (ref 150–400)
RBC: 4.44 MIL/uL (ref 3.87–5.11)
RDW: 12.3 % (ref 11.5–15.5)
WBC: 6.6 10*3/uL (ref 4.0–10.5)
nRBC: 0 % (ref 0.0–0.2)

## 2020-03-04 LAB — COMPREHENSIVE METABOLIC PANEL
ALT: 19 U/L (ref 0–44)
AST: 21 U/L (ref 15–41)
Albumin: 3.8 g/dL (ref 3.5–5.0)
Alkaline Phosphatase: 28 U/L — ABNORMAL LOW (ref 38–126)
Anion gap: 8 (ref 5–15)
BUN: 15 mg/dL (ref 6–20)
CO2: 25 mmol/L (ref 22–32)
Calcium: 9 mg/dL (ref 8.9–10.3)
Chloride: 105 mmol/L (ref 98–111)
Creatinine, Ser: 0.67 mg/dL (ref 0.44–1.00)
GFR calc Af Amer: >60 mL/min (ref >60)
GFR calc non Af Amer: >60 mL/min (ref >60)
Glucose, Bld: 114 mg/dL — ABNORMAL HIGH (ref 70–99)
Potassium: 4 mmol/L (ref 3.5–5.1)
Sodium: 138 mmol/L (ref 135–145)
Total Bilirubin: 0.5 mg/dL (ref 0.3–1.2)
Total Protein: 7 g/dL (ref 6.5–8.1)

## 2020-03-04 LAB — PROTIME-INR
INR: 1 (ref 0.8–1.2)
Prothrombin Time: 13 seconds (ref 11.4–15.2)

## 2020-03-04 LAB — APTT: aPTT: 32 seconds (ref 24–36)

## 2020-03-04 LAB — SARS CORONAVIRUS 2 (TAT 6-24 HRS): SARS Coronavirus 2: NEGATIVE

## 2020-03-05 NOTE — Progress Notes (Signed)
Spoke with patient by phone, patient instructed to follow all bowel prep instruction from dr white.

## 2020-03-06 ENCOUNTER — Ambulatory Visit (HOSPITAL_BASED_OUTPATIENT_CLINIC_OR_DEPARTMENT_OTHER): Payer: BC Managed Care – PPO | Admitting: Certified Registered"

## 2020-03-06 ENCOUNTER — Ambulatory Visit (HOSPITAL_BASED_OUTPATIENT_CLINIC_OR_DEPARTMENT_OTHER)
Admission: RE | Admit: 2020-03-06 | Discharge: 2020-03-06 | Disposition: A | Discharge status: Home / Self Care | Payer: BC Managed Care – PPO | Attending: Surgery | Admitting: Surgery

## 2020-03-06 ENCOUNTER — Encounter (HOSPITAL_BASED_OUTPATIENT_CLINIC_OR_DEPARTMENT_OTHER): Payer: Self-pay | Admitting: Surgery

## 2020-03-06 ENCOUNTER — Encounter (HOSPITAL_BASED_OUTPATIENT_CLINIC_OR_DEPARTMENT_OTHER)
Admission: RE | Disposition: A | Discharge status: Home / Self Care | Payer: Self-pay | Source: Home / Self Care | Attending: Surgery

## 2020-03-06 DIAGNOSIS — K5900 Constipation, unspecified: Secondary | ICD-10-CM | POA: Insufficient documentation

## 2020-03-06 DIAGNOSIS — K644 Residual hemorrhoidal skin tags: Secondary | ICD-10-CM | POA: Diagnosis not present

## 2020-03-06 DIAGNOSIS — E785 Hyperlipidemia, unspecified: Secondary | ICD-10-CM | POA: Diagnosis not present

## 2020-03-06 DIAGNOSIS — J302 Other seasonal allergic rhinitis: Secondary | ICD-10-CM | POA: Diagnosis not present

## 2020-03-06 DIAGNOSIS — F418 Other specified anxiety disorders: Secondary | ICD-10-CM | POA: Diagnosis not present

## 2020-03-06 HISTORY — DX: Generalized anxiety disorder: F41.1

## 2020-03-06 HISTORY — DX: Major depressive disorder, single episode, unspecified: F32.9

## 2020-03-06 HISTORY — PX: EVALUATION UNDER ANESTHESIA WITH HEMORRHOIDECTOMY: SHX5624

## 2020-03-06 HISTORY — DX: Leiomyoma of uterus, unspecified: D25.9

## 2020-03-06 HISTORY — DX: Residual hemorrhoidal skin tags: K64.4

## 2020-03-06 HISTORY — DX: Pure hyperglyceridemia: E78.1

## 2020-03-06 LAB — POCT PREGNANCY, URINE: Preg Test, Ur: NEGATIVE

## 2020-03-06 SURGERY — EXAM UNDER ANESTHESIA WITH HEMORRHOIDECTOMY
Anesthesia: General | Site: Rectum

## 2020-03-06 MED ORDER — ONDANSETRON HCL 4 MG/2ML IJ SOLN
4.0000 mg | Freq: Once | INTRAMUSCULAR | Status: DC | PRN
  Filled 2020-03-06: qty 2

## 2020-03-06 MED ORDER — OXYCODONE HCL 5 MG PO TABS
5.0000 mg | ORAL_TABLET | Freq: Once | ORAL | Status: DC | PRN
  Filled 2020-03-06: qty 1

## 2020-03-06 MED ORDER — PROPOFOL 10 MG/ML IV BOLUS
INTRAVENOUS | Status: AC
  Filled 2020-03-06: qty 20

## 2020-03-06 MED ORDER — ONDANSETRON 4 MG PO TBDP: 4.0000 mg | ORAL_TABLET | Freq: Four times a day (QID) | ORAL | 0 refills | Status: DC | PRN

## 2020-03-06 MED ORDER — PHENYLEPHRINE 40 MCG/ML (10ML) SYRINGE FOR IV PUSH (FOR BLOOD PRESSURE SUPPORT)
PREFILLED_SYRINGE | INTRAVENOUS | Status: DC | PRN
  Administered 2020-03-06: 80 ug via INTRAVENOUS

## 2020-03-06 MED ORDER — FENTANYL CITRATE (PF) 100 MCG/2ML IJ SOLN
25.0000 ug | INTRAMUSCULAR | Status: DC | PRN
  Filled 2020-03-06: qty 1

## 2020-03-06 MED ORDER — PROPOFOL 10 MG/ML IV BOLUS
INTRAVENOUS | Status: DC | PRN
  Administered 2020-03-06: 150 mg via INTRAVENOUS

## 2020-03-06 MED ORDER — ACETAMINOPHEN 500 MG PO TABS
ORAL_TABLET | ORAL | Status: AC
  Filled 2020-03-06: qty 2

## 2020-03-06 MED ORDER — BUPIVACAINE LIPOSOME 1.3 % IJ SUSP
INTRAMUSCULAR | Status: DC | PRN
  Administered 2020-03-06: 20 mL

## 2020-03-06 MED ORDER — ROCURONIUM BROMIDE 10 MG/ML (PF) SYRINGE
PREFILLED_SYRINGE | INTRAVENOUS | Status: DC | PRN
  Administered 2020-03-06: 50 mg via INTRAVENOUS

## 2020-03-06 MED ORDER — DEXAMETHASONE SODIUM PHOSPHATE 10 MG/ML IJ SOLN
INTRAMUSCULAR | Status: DC | PRN
  Administered 2020-03-06: 10 mg via INTRAVENOUS

## 2020-03-06 MED ORDER — ONDANSETRON HCL 4 MG/2ML IJ SOLN
INTRAMUSCULAR | Status: AC
  Filled 2020-03-06: qty 2

## 2020-03-06 MED ORDER — BUPIVACAINE LIPOSOME 1.3 % IJ SUSP
20.0000 mL | Freq: Once | INTRAMUSCULAR | Status: DC
  Filled 2020-03-06: qty 20

## 2020-03-06 MED ORDER — PHENYLEPHRINE 40 MCG/ML (10ML) SYRINGE FOR IV PUSH (FOR BLOOD PRESSURE SUPPORT)
PREFILLED_SYRINGE | INTRAVENOUS | Status: AC
  Filled 2020-03-06: qty 10

## 2020-03-06 MED ORDER — CHLORHEXIDINE GLUCONATE CLOTH 2 % EX PADS
6.0000 | MEDICATED_PAD | Freq: Once | CUTANEOUS | Status: DC
  Filled 2020-03-06: qty 6

## 2020-03-06 MED ORDER — DEXAMETHASONE SODIUM PHOSPHATE 10 MG/ML IJ SOLN
INTRAMUSCULAR | Status: AC
  Filled 2020-03-06: qty 1

## 2020-03-06 MED ORDER — LIDOCAINE 2% (20 MG/ML) 5 ML SYRINGE
INTRAMUSCULAR | Status: DC | PRN
  Administered 2020-03-06: 100 mg via INTRAVENOUS

## 2020-03-06 MED ORDER — PROPOFOL 500 MG/50ML IV EMUL
INTRAVENOUS | Status: DC | PRN
  Administered 2020-03-06: 150 ug/kg/min via INTRAVENOUS

## 2020-03-06 MED ORDER — CELECOXIB 200 MG PO CAPS
200.0000 mg | ORAL_CAPSULE | ORAL | Status: AC
  Administered 2020-03-06: 07:00:00 200 mg via ORAL
  Filled 2020-03-06: qty 1

## 2020-03-06 MED ORDER — FENTANYL CITRATE (PF) 100 MCG/2ML IJ SOLN
INTRAMUSCULAR | Status: DC | PRN
  Administered 2020-03-06 (×2): 50 ug via INTRAVENOUS

## 2020-03-06 MED ORDER — MIDAZOLAM HCL 2 MG/2ML IJ SOLN
INTRAMUSCULAR | Status: AC
  Filled 2020-03-06: qty 2

## 2020-03-06 MED ORDER — ONDANSETRON HCL 4 MG/2ML IJ SOLN
INTRAMUSCULAR | Status: DC | PRN
  Administered 2020-03-06: 4 mg via INTRAVENOUS

## 2020-03-06 MED ORDER — FENTANYL CITRATE (PF) 100 MCG/2ML IJ SOLN
INTRAMUSCULAR | Status: AC
  Filled 2020-03-06: qty 2

## 2020-03-06 MED ORDER — TRAMADOL HCL 50 MG PO TABS: 50.0000 mg | ORAL_TABLET | Freq: Four times a day (QID) | ORAL | 0 refills | Status: AC | PRN

## 2020-03-06 MED ORDER — BUPIVACAINE-EPINEPHRINE 0.25% -1:200000 IJ SOLN
INTRAMUSCULAR | Status: DC | PRN
  Administered 2020-03-06: 30 mL

## 2020-03-06 MED ORDER — MIDAZOLAM HCL 5 MG/5ML IJ SOLN
INTRAMUSCULAR | Status: DC | PRN
  Administered 2020-03-06: 2 mg via INTRAVENOUS

## 2020-03-06 MED ORDER — LACTATED RINGERS IV SOLN
INTRAVENOUS | Status: DC
  Filled 2020-03-06: qty 1000

## 2020-03-06 MED ORDER — ROCURONIUM BROMIDE 10 MG/ML (PF) SYRINGE
PREFILLED_SYRINGE | INTRAVENOUS | Status: AC
  Filled 2020-03-06: qty 10

## 2020-03-06 MED ORDER — SUGAMMADEX SODIUM 200 MG/2ML IV SOLN
INTRAVENOUS | Status: DC | PRN
  Administered 2020-03-06: 200 mg via INTRAVENOUS

## 2020-03-06 MED ORDER — ACETAMINOPHEN 500 MG PO TABS
1000.0000 mg | ORAL_TABLET | ORAL | Status: AC
  Administered 2020-03-06: 07:00:00 1000 mg via ORAL
  Filled 2020-03-06: qty 2

## 2020-03-06 MED ORDER — OXYCODONE HCL 5 MG/5ML PO SOLN
5.0000 mg | Freq: Once | ORAL | Status: DC | PRN
  Filled 2020-03-06: qty 5

## 2020-03-06 MED ORDER — LIDOCAINE 2% (20 MG/ML) 5 ML SYRINGE
INTRAMUSCULAR | Status: AC
  Filled 2020-03-06: qty 5

## 2020-03-06 MED ORDER — PROPOFOL 500 MG/50ML IV EMUL
INTRAVENOUS | Status: AC
  Filled 2020-03-06: qty 50

## 2020-03-06 MED ORDER — DIBUCAINE (PERIANAL) 1 % EX OINT
TOPICAL_OINTMENT | CUTANEOUS | Status: DC | PRN
  Administered 2020-03-06: 1 via RECTAL

## 2020-03-06 MED ORDER — CELECOXIB 200 MG PO CAPS
ORAL_CAPSULE | ORAL | Status: AC
  Filled 2020-03-06: qty 1

## 2020-03-06 SURGICAL SUPPLY — 45 items
BLADE EXTENDED COATED 6.5IN (ELECTRODE) ×2 IMPLANT
BLADE HEX COATED 2.75 (ELECTRODE) ×2 IMPLANT
BLADE SURG 10 STRL SS (BLADE) IMPLANT
BLADE SURG 15 STRL LF DISP TIS (BLADE) IMPLANT
BLADE SURG 15 STRL SS (BLADE)
BRIEF STRETCH FOR OB PAD LRG (UNDERPADS AND DIAPERS) IMPLANT
COVER BACK TABLE 60X90IN (DRAPES) ×2 IMPLANT
COVER MAYO STAND STRL (DRAPES) ×2 IMPLANT
COVER WAND RF STERILE (DRAPES) ×3 IMPLANT
DRAPE LAPAROTOMY 100X72 PEDS (DRAPES) ×2 IMPLANT
DRAPE UTILITY XL STRL (DRAPES) ×2 IMPLANT
DRSG PAD ABDOMINAL 8X10 ST (GAUZE/BANDAGES/DRESSINGS) ×1 IMPLANT
ELECT REM PT RETURN 9FT ADLT (ELECTROSURGICAL) ×2
ELECTRODE REM PT RTRN 9FT ADLT (ELECTROSURGICAL) ×1 IMPLANT
GAUZE SPONGE 4X4 12PLY STRL (GAUZE/BANDAGES/DRESSINGS) ×2 IMPLANT
GLOVE BIO SURGEON STRL SZ7.5 (GLOVE) ×2 IMPLANT
GLOVE INDICATOR 8.0 STRL GRN (GLOVE) ×2 IMPLANT
GOWN STRL REUS W/TWL XL LVL3 (GOWN DISPOSABLE) ×2 IMPLANT
KIT SIGMOIDOSCOPE (SET/KITS/TRAYS/PACK) IMPLANT
KIT TURNOVER CYSTO (KITS) ×2 IMPLANT
NEEDLE HYPO 22GX1.5 SAFETY (NEEDLE) ×2 IMPLANT
NS IRRIG 500ML POUR BTL (IV SOLUTION) ×2 IMPLANT
PACK BASIN DAY SURGERY FS (CUSTOM PROCEDURE TRAY) ×2 IMPLANT
PAD ARMBOARD 7.5X6 YLW CONV (MISCELLANEOUS) IMPLANT
PENCIL BUTTON HOLSTER BLD 10FT (ELECTRODE) ×2 IMPLANT
SPONGE SURGIFOAM ABS GEL 100 (HEMOSTASIS) IMPLANT
SPONGE SURGIFOAM ABS GEL 12-7 (HEMOSTASIS) IMPLANT
SUT CHROMIC 2 0 SH (SUTURE) IMPLANT
SUT CHROMIC 3 0 SH 27 (SUTURE) IMPLANT
SUT CHROMIC 4 0 SH 27 (SUTURE) ×1 IMPLANT
SUT MNCRL AB 4-0 PS2 18 (SUTURE) IMPLANT
SUT VIC AB 2-0 SH 27 (SUTURE)
SUT VIC AB 2-0 SH 27XBRD (SUTURE) IMPLANT
SUT VIC AB 3-0 SH 27 (SUTURE) ×2
SUT VIC AB 3-0 SH 27X BRD (SUTURE) IMPLANT
SUT VIC AB 4-0 P-3 18XBRD (SUTURE) IMPLANT
SUT VIC AB 4-0 P3 18 (SUTURE)
SUT VIC AB 4-0 SH 18 (SUTURE) IMPLANT
SUT VIC AB 4-0 SH 27 (SUTURE)
SUT VIC AB 4-0 SH 27XANBCTRL (SUTURE) IMPLANT
SYR CONTROL 10ML LL (SYRINGE) ×2 IMPLANT
TRAY DSU PREP LF (CUSTOM PROCEDURE TRAY) ×2 IMPLANT
TUBE CONNECTING 12X1/4 (SUCTIONS) ×2 IMPLANT
WATER STERILE IRR 500ML POUR (IV SOLUTION) IMPLANT
YANKAUER SUCT BULB TIP NO VENT (SUCTIONS) ×2 IMPLANT

## 2020-03-06 NOTE — Op Note (Signed)
03/06/2020  9:09 AM  PATIENT:  Alisha Wagner  44 y.o. female  Patient Care Team: Pleas Koch, NP as PCP - General (Internal Medicine)  PRE-OPERATIVE DIAGNOSIS:  External hemorrhoid  POST-OPERATIVE DIAGNOSIS:  Same  PROCEDURE:   1. Hemorrhoidectomy 2. Anorectal exam under anesthesia  SURGEON:  Surgeon(s): Ileana Roup, MD   ANESTHESIA:   local and general  SPECIMEN:  Anterior midline hemorrhoidal tag  DISPOSITION OF SPECIMEN:  PATHOLOGY  COUNTS:  Sponge, needle, and instrument counts were reported correct x2 at conclusion.  EBL: 2 mL  Drains: None  PLAN OF CARE: Discharge to home after PACU  PATIENT DISPOSITION:  PACU - hemodynamically stable.  INDICATION: Ms. [name] is a very pleasant 64yoF with hx of HLD, seasonal allergies who reports a near lifelong history of occasional intermittent bright red blood per rectum and prolapsing anal tissue.  This has become more problematic over the few months.  She also has had some swelling and painful perianal tissue.  She does have intermittent history of constipation.  She was seen with gastroenterology, Dr. Loletha Carrow, whom is following her.  He referred her to [place] given these large prolapsing external tag/hemorrhoid.  She was seen in the office.  She did have a large prolapsing piece of anal tissue in the anterior midline external hemorrhoid.  We discussed options moving forward and she opted to proceed with surgery.  Please refer to notes elsewhere for details regarding this discussion.  OR FINDINGS: Very small circumferential external hemorrhoids that were not swollen or edematous.  Within the anterior midline there was a large prolapsing external hemorrhoidal tag.  Circumferential anoscopy demonstrated an otherwise normal-appearing anal canal.  There is no other ulcerations or lesions present.  This large external tag was excised and passed off the specimen.  DESCRIPTION: The patient was identified in the preoperative  holding area and taken to the OR where SCDs were placed and she underwent general endotracheal anesthesia.  She was then positioned on the operating table in the prone jackknife position.  The buttocks were gently taped apart.  Pressure points were then verified and padded. She was then prepped and draped in usual sterile fashion.  A surgical timeout was performed indicating the correct patient, procedure, positioning.  A perianal block was then created using a dilute mixture of Exparel with Marcaine.  A well lubricated digital rectal exam was performed which demonstrated a normal anal canal and distal rectum without palpable masses.  Within the anterior midline there was a large prolapsed external hemorrhoidal tag/epithelial tag.  A Hill-Ferguson anoscope was inserted and circumferential inspection demonstrated a normal-appearing anal canal.  There is no granulation tissue or ulcerations present.  There were no fissures.  There were no large internal hemorrhoids.  She does have small external hemorrhoids that are nonedematous or even amenable to excision.  The anterior midline hemorrhoidal tag was elevated with a clamp and incised sharply.  The external sphincter muscle was teased away from this.  No muscle was divided.  The tag was excised fully and passed off the specimen.  This was soft and mobile.  This does not appear to be any sort of malignant lesion.  Hemostasis was then verified.  The wound was closed in 2 layers using a 3-0 Vicryl deep dermal suture and 4-0 Chromic interrupted skin stitch.   DISPOSITION: PACU in satisfactory condition.

## 2020-03-06 NOTE — Anesthesia Procedure Notes (Signed)
Procedure Name: Intubation Date/Time: 03/06/2020 8:24 AM Performed by: Lannie Heaps D, CRNA Pre-anesthesia Checklist: Patient identified, Emergency Drugs available, Suction available and Patient being monitored Patient Re-evaluated:Patient Re-evaluated prior to induction Oxygen Delivery Method: Circle system utilized Preoxygenation: Pre-oxygenation with 100% oxygen Induction Type: IV induction Ventilation: Mask ventilation without difficulty Laryngoscope Size: Mac and 4 Tube type: Oral Tube size: 7.0 mm Number of attempts: 1 Airway Equipment and Method: Stylet Placement Confirmation: ETT inserted through vocal cords under direct vision,  positive ETCO2 and breath sounds checked- equal and bilateral Secured at: 21 cm Tube secured with: Tape Dental Injury: Teeth and Oropharynx as per pre-operative assessment

## 2020-03-06 NOTE — Anesthesia Preprocedure Evaluation (Addendum)
Anesthesia Evaluation  Patient identified by MRN, date of birth, ID band Patient awake    Reviewed: Allergy & Precautions, NPO status , Patient's Chart, lab work & pertinent test results  History of Anesthesia Complications (+) PONV and history of anesthetic complications  Airway Mallampati: II  TM Distance: >3 FB Neck ROM: Full    Dental  (+) Teeth Intact   Pulmonary neg pulmonary ROS,    Pulmonary exam normal        Cardiovascular negative cardio ROS Normal cardiovascular exam     Neuro/Psych PSYCHIATRIC DISORDERS Anxiety Depression negative neurological ROS     GI/Hepatic negative GI ROS, Neg liver ROS,   Endo/Other  negative endocrine ROS  Renal/GU negative Renal ROS  negative genitourinary   Musculoskeletal negative musculoskeletal ROS (+)   Abdominal   Peds  Hematology negative hematology ROS (+)   Anesthesia Other Findings HLD  Reproductive/Obstetrics                           Anesthesia Physical Anesthesia Plan  ASA: II  Anesthesia Plan: General   Post-op Pain Management:    Induction: Intravenous  PONV Risk Score and Plan: 4 or greater and Ondansetron, Dexamethasone, Treatment may vary due to age or medical condition, Midazolam, TIVA and Propofol infusion  Airway Management Planned: Oral ETT  Additional Equipment: None  Intra-op Plan:   Post-operative Plan: Extubation in OR  Informed Consent: I have reviewed the patients History and Physical, chart, labs and discussed the procedure including the risks, benefits and alternatives for the proposed anesthesia with the patient or authorized representative who has indicated his/her understanding and acceptance.     Dental advisory given  Plan Discussed with:   Anesthesia Plan Comments:       Anesthesia Quick Evaluation

## 2020-03-06 NOTE — Anesthesia Postprocedure Evaluation (Signed)
Anesthesia Post Note  Patient: Alisha Wagner  Procedure(s) Performed: ANORECTAL EXAM UNDER ANESTHESIA WITH EXTERNAL HEMORRHOIDECTOMY (N/A Rectum)     Patient location during evaluation: PACU Anesthesia Type: General Level of consciousness: awake and alert Pain management: pain level controlled Vital Signs Assessment: post-procedure vital signs reviewed and stable Respiratory status: spontaneous breathing, nonlabored ventilation and respiratory function stable Cardiovascular status: blood pressure returned to baseline and stable Postop Assessment: no apparent nausea or vomiting Anesthetic complications: no    Last Vitals:  Vitals:   03/06/20 1000 03/06/20 1015  BP: 109/70 (!) 107/57  Pulse: 79 70  Resp: 16 16  Temp:    SpO2: 90% 92%    Last Pain:  Vitals:   03/06/20 1015  TempSrc:   PainSc: 0-No pain                 Lidia Collum

## 2020-03-06 NOTE — Transfer of Care (Signed)
Immediate Anesthesia Transfer of Care Note  Patient: Alisha Wagner  Procedure(s) Performed: ANORECTAL EXAM UNDER ANESTHESIA WITH EXTERNAL HEMORRHOIDECTOMY (N/A Rectum)  Patient Location: PACU  Anesthesia Type:General  Level of Consciousness: awake, alert  and oriented  Airway & Oxygen Therapy: Patient Spontanous Breathing and Patient connected to face mask oxygen  Post-op Assessment: Report given to RN and Post -op Vital signs reviewed and stable  Post vital signs: Reviewed and stable  Last Vitals:  Vitals Value Taken Time  BP 91/72 03/06/20 0933  Temp    Pulse 94 03/06/20 0936  Resp 18 03/06/20 0936  SpO2 95 % 03/06/20 0936  Vitals shown include unvalidated device data.  Last Pain:  Vitals:   03/06/20 0652  TempSrc: Oral  PainSc: 0-No pain      Patients Stated Pain Goal: 7 ([id number])  Complications: No apparent anesthesia complications

## 2020-03-06 NOTE — H&P (Signed)
CC: Here today for surgery  HPI: Alisha Wagner is a very pleasant 60yoF with hx of hypertriglyceridemia, seasonal allergies, who reports a near lifelong history of occasional intermittent bright red blood per rectum and prolapsing anal tissue. These have become more problematic in the last month. 3 weeks ago, she struggled with some issues regarding constipation due to dietary changes-Keto diet. With this, she had painful swollen perianal tissue that has been now subsiding improving since she has gone back on a high-fiber diet. She does not currently take a fiber supplement. She estimates that she drinks 6-9 cups of water per day. Currently, having 1 soft bowel movement per day but recently did struggle with some constipation. She spends approximately 2-3 minutes on the commode with a bowel movement.  She has never had a colonoscopy but is following with Dr. Loletha Carrow  INTERVAL HX She has been doing well - denies any changes in her health or health history since we met in the office. Denies any new medications or issues. Reports she is ready for surgery.  PMH: hypertriglyceridemia (well controlled with statin), seasonal allergies (well controlled with prn antihistamines)  PSH: Denies any prior anorectal procedures or surgeries  FHx: Denies FHx of malignancy  Social: Denies use of tobacco/drugs; 4-5 drinks of EtOH per week; works as a Radiographer, therapeutic  ROS: A comprehensive 10 system review of systems was completed with the patient and pertinent findings as noted above.  Past Medical History:  Diagnosis Date  . External hemorrhoids   . GAD (generalized anxiety disorder)   . Hypertriglyceridemia   . MDD (major depressive disorder)   . PONV (postoperative nausea and vomiting)   . Seasonal allergies   . Uterine fibroid     Past Surgical History:  Procedure Laterality Date  . HAND SURGERY Right 1996   complex laceration repair  . MYOMECTOMY N/A 05/02/2015   Procedure: EXPLORATORY  LAPAROTOMY,  MYOMECTOMY;  Surgeon: Servando Salina, MD;  Location: [facility];  Service: Gynecology;  Laterality: N/A;  . REFRACTIVE SURGERY  12/2014   bilateral lasik  . WISDOM TOOTH EXTRACTION    . WISDOM TOOTH EXTRACTION  2010    Family History  Problem Relation Age of Onset  . Muscular dystrophy Father   . Heart disease Father        s/p CABG  . Diabetes Mother   . Kidney disease Mother     Social:  reports that she has never smoked. She has never used smokeless tobacco. She reports current alcohol use. She reports that she does not use drugs.  Allergies: No Known Allergies  Medications: I have reviewed the patient's current medications.  Results for orders placed or performed during the hospital encounter of 03/06/20 (from the past 48 hour(s))  Pregnancy, urine POC     Status: None   Collection Time: 03/06/20  7:07 AM  Result Value Ref Range   Preg Test, Ur NEGATIVE NEGATIVE    Comment:        THE SENSITIVITY OF THIS METHODOLOGY IS >24 mIU/mL     No results found.  ROS - all of the below systems have been reviewed with the patient and positives are indicated with bold text General: chills, fever or night sweats Eyes: blurry vision or double vision ENT: epistaxis or sore throat Allergy/Immunology: itchy/watery eyes or nasal congestion Hematologic/Lymphatic: bleeding problems, blood clots or swollen lymph nodes Endocrine: temperature intolerance or unexpected weight changes Breast: new or changing breast lumps or nipple discharge Resp: cough, shortness of breath,  or wheezing CV: chest pain or dyspnea on exertion GI: as per HPI GU: dysuria, trouble voiding, or hematuria MSK: joint pain or joint stiffness Neuro: TIA or stroke symptoms Derm: pruritus and skin lesion changes Psych: anxiety and depression  PE Blood pressure 126/78, pulse (!) 54, temperature (!) 97.3 F (36.3 C), temperature source Oral, resp. rate 16, height '5\' 6"'  (1.676 m), weight 96.1 kg, last  menstrual period 02/28/2020, SpO2 97 %. Constitutional: NAD; conversant Eyes: Moist conjunctiva; no lid lag; anicteric; pupils equal and round Lungs: Normal respiratory effort CV: RRR GI: Abd soft; nontender/nondistended MSK: Normal range of motion of extremities Psychiatric: Appropriate affect  Results for orders placed or performed during the hospital encounter of 03/06/20 (from the past 48 hour(s))  Pregnancy, urine POC     Status: None   Collection Time: 03/06/20  7:07 AM  Result Value Ref Range   Preg Test, Ur NEGATIVE NEGATIVE    Comment:        THE SENSITIVITY OF THIS METHODOLOGY IS >24 mIU/mL     A/P: Alisha Wagner is a very pleasant 49yoF with hx of HLD, seasonal allergies - here today for surgery regarding large external/prolapsed tag is approximately 2 cm long but has a narrow base.  -We have again reviewed excision of external hemorrhoidal tag, examination under anesthesia. -The planned procedure, material risks (including, but not limited to, pain, bleeding, infection, scarring, need for blood transfusion, damage to anal sphincter, incontinence of gas and/or stool although quite uncommon, need for additional procedures, recurrence of hemorrhoids, pneumonia, heart attack, stroke, death) benefits and alternatives to surgery were discussed at length. I noted a good probability that the procedure would help improve her symptoms and additionally with hygiene. The patient's questions were answered to her satisfaction, she voiced understanding and elected to proceed with surgery. Additionally, we discussed typical postoperative expectations and the recovery process.  Sharon Mt. Dema Severin, M.D. [facility] Use [email or web address] to contact on call provider

## 2020-03-06 NOTE — Discharge Instructions (Addendum)
ANORECTAL SURGERY: POST OP INSTRUCTIONS  1. DIET: Follow a light bland diet the first 24 hours after arrival home, such as soup, liquids, crackers, etc.  Be sure to include lots of fluids daily.  Avoid fast food or heavy meals as your are more likely to get nauseated.  Eat a low fat diet the next few days after surgery.    2. Take your usually prescribed home medications unless otherwise directed.  3. PAIN CONTROL: a. It is helpful to take an over-the-counter pain medication regularly for the first few days/weeks.  Choose from the following that works best for you: i. Ibuprofen (Advil, etc) Three 228m tabs every 6 hours as needed. ii. Acetaminophen (Tylenol, etc) 500-6585mevery 6 hours as needed iii. NOTE: You may take both of these medications together - most patients find it most helpful when alternating between the two (i.e. Ibuprofen at 6am, tylenol at 9am, ibuprofen at 12pm ...) b. A  prescription for pain medication may have been prescribed for you at discharge.  Take your pain medication as prescribed.  i. If you are having problems/concerns with the prescription medicine, please call us[place]oreaor further advice.  4. Avoid getting constipated.  Between the surgery and the pain medications, it is common to experience some constipation.  Increasing fluid intake (64oz of water per day) and taking a fiber supplement (such as Metamucil, Citrucel, FiberCon) 1-2 times a day regularly will usually help prevent this problem from occurring.  Take Miralax (over the counter) 1-2x/day while taking a narcotic pain medication. If no bowel movement after 48hours, you may additionally take a laxative like a bottle of Milk of Magnesia which can be purchased over the counter. Avoid enemas if possible as these are often painful.   5. Watch out for diarrhea.  If you have many loose bowel movements, simplify your diet to bland foods.  Stop any stool softeners and decrease your fiber supplement. If this worsens or does  not improve, please call us[place] 6. Wash / shower every day.  If you were discharged with a dressing, you may remove this the day after your surgery. You may shower normally, getting soap/water on your wound, particularly after bowel movements.  7. Soaking in a warm bath filled a couple inches ("Sitz bath") is a great way to clean the area after a bowel movement and many patients find it is a way to soothe the area.  8. ACTIVITIES as tolerated:   a. You may resume regular (light) daily activities beginning the next day--such as daily self-care, walking, climbing stairs--gradually increasing activities as tolerated.  If you can walk 30 minutes without difficulty, it is safe to try more intense activity such as jogging, treadmill, bicycling, low-impact aerobics, etc. b. Refrain from any heavy lifting or straining for the first 2 weeks after your procedure, particularly if your surgery was for hemorrhoids. c. Avoid activities that make your pain worse d. You may drive when you are no longer taking prescription pain medication, you can comfortably wear a seatbelt, and you can safely maneuver your car and apply brakes.  9. FOLLOW UP in our office a. Please call [facility] at [phone] to set up an appointment to see your surgeon in the office for a follow-up appointment approximately 2 weeks after your surgery. b. Make sure that you call for this appointment the day you arrive home to insure a convenient appointment time.  9. If you have disability or family leave forms that need to be completed,  you may have them completed by your primary care physician's office; for return to work instructions, please ask our office staff and they will be happy to assist you in obtaining this documentation   When to call us [phone]: 1. Poor pain control 2. Reactions / problems with new medications (rash/itching, etc)  3. Fever over 101.5 F (38.5 C) 4. Inability to urinate 5. Nausea/vomiting 6. Worsening  swelling or bruising 7. Continued bleeding from incision. 8. Increased pain, redness, or drainage from the incision  The clinic staff is available to answer your questions during regular business hours (8:30am-5pm).  Please don't hesitate to call and ask to speak to one of our nurses for clinical concerns.   A surgeon from [facility] is always on call at the hospitals   If you have a medical emergency, go to the nearest emergency room or call 911.   [facility], [place], [place], [place], [place]  [address] ? MAIN: [phone] FAX [phone] [email or web address]  Post Anesthesia Home Care Instructions  Activity: Get plenty of rest for the remainder of the day. A responsible adult should stay with you for 24 hours following the procedure.  For the next 24 hours, DO NOT: -Drive a car -Paediatric nurse -Drink alcoholic beverages -Take any medication unless instructed by your physician -Make any legal decisions or sign important papers.  Meals: Start with liquid foods such as gelatin or soup. Progress to regular foods as tolerated. Avoid greasy, spicy, heavy foods. If nausea and/or vomiting occur, drink only clear liquids until the nausea and/or vomiting subsides. Call your physician if vomiting continues.  Special Instructions/Symptoms: Your throat may feel dry or sore from the anesthesia or the breathing tube placed in your throat during surgery. If this causes discomfort, gargle with warm salt water. The discomfort should disappear within 24 hours.  If you had a scopolamine patch placed behind your ear for the management of post- operative nausea and/or vomiting:  1. The medication in the patch is effective for 72 hours, after which it should be removed.  Wrap patch in a tissue and discard in the trash. Wash hands thoroughly with soap and water. 2. You may remove the patch earlier than 72 hours if you experience unpleasant  side effects which may include dry mouth, dizziness or visual disturbances. 3. Avoid touching the patch. Wash your hands with soap and water after contact with the patch.   Information for Discharge Teaching: EXPAREL (bupivacaine liposome injectable suspension)   Your surgeon gave you EXPAREL(bupivacaine) in your surgical incision to help control your pain after surgery.   EXPAREL is a local anesthetic that provides pain relief by numbing the tissue around the surgical site.  EXPAREL is designed to release pain medication over time and can control pain for up to 72 hours.  Depending on how you respond to EXPAREL, you may require less pain medication during your recovery.  Possible side effects:  Temporary loss of sensation or ability to move in the area where bupivacaine was injected.  Nausea, vomiting, constipation  Rarely, numbness and tingling in your mouth or lips, lightheadedness, or anxiety may occur.  Call your doctor right away if you think you may be experiencing any of these sensations, or if you have other questions regarding possible side effects.  Follow all other discharge instructions given to you by your surgeon or nurse. Eat a healthy diet and drink plenty of water or other fluids.  If you  return to the hospital for any reason within 96 hours following the administration of EXPAREL, please inform your health care providers.

## 2020-03-07 LAB — SURGICAL PATHOLOGY

## 2020-04-29 ENCOUNTER — Ambulatory Visit: Payer: BC Managed Care – PPO | Admitting: Adult Health

## 2020-05-06 ENCOUNTER — Ambulatory Visit (INDEPENDENT_AMBULATORY_CARE_PROVIDER_SITE_OTHER): Payer: BC Managed Care – PPO | Admitting: Adult Health

## 2020-05-06 ENCOUNTER — Encounter: Payer: Self-pay | Admitting: Adult Health

## 2020-05-06 ENCOUNTER — Other Ambulatory Visit: Payer: Self-pay

## 2020-05-06 DIAGNOSIS — F411 Generalized anxiety disorder: Secondary | ICD-10-CM

## 2020-05-06 DIAGNOSIS — F331 Major depressive disorder, recurrent, moderate: Secondary | ICD-10-CM | POA: Diagnosis not present

## 2020-05-06 MED ORDER — FLUOXETINE HCL 10 MG PO CAPS: 10.0000 mg | ORAL_CAPSULE | Freq: Every day | ORAL | 3 refills | Status: DC

## 2020-05-06 NOTE — Progress Notes (Signed)
Alisha Wagner [id number] June 03, 1976 44 y.o.  Subjective:   Patient ID:  Alisha Wagner is a 44 y.o. (DOB 06/09/1976) female.  Chief Complaint: No chief complaint on file.   HPI Alisha Wagner presents to the office today for follow-up of anxiety and depression.  Describes mood today as "alright". Pleasant. Mood symptoms - denies depression, anxiety, and irritability. Stating "I'm doing pretty good". Would like to decrease Prozac to 10mg  daily. Some concerns about ADHD. Mother in long term care - assisted living. Aunt passed away recently. Stable interest and motivation. Taking medications as prescribed.  Energy levels stable. Active, has a regular exercise routine. Gardening. Works full-time - at home [employer]. Enjoys some usual interests and activities. Lives with partner of 21 years. Spending time with family and friends. Mother local. Appetite adequate. Weight stable. Sleeps well most nights. Averages 9 hours. Focus and concentration "ok" - concerns about being ADHD. Completing tasks. Managing aspects of household. Work going well - out for the summer. Denies SI or HI. Denies AH or VH.   PHQ2-9     Office Visit from 02/12/2015 in [place]  PHQ-2 Total Score  0       Review of Systems:  Review of Systems  Musculoskeletal: Negative for gait problem.  Neurological: Negative for tremors.  Psychiatric/Behavioral:       Please refer to HPI    Medications: I have reviewed the patient's current medications.  Current Outpatient Medications  Medication Sig Dispense Refill  . B Complex-Biotin-FA (B-COMPLEX PO) Take 1 tablet by mouth daily.     . fish oil-omega-3 fatty acids 1000 MG capsule Take 1 g by mouth 2 (two) times daily.     Marland Kitchen FLUoxetine (PROZAC) 10 MG capsule Take 1 capsule (10 mg total) by mouth daily. 90 capsule 3  . Lactobacillus (PROBIOTIC ACIDOPHILUS PO) Take by mouth daily.     Marland Kitchen levocetirizine (XYZAL) 5 MG tablet Take 5 mg by mouth every  evening.     . montelukast (SINGULAIR) 10 MG tablet Take 1 tablet (10 mg total) by mouth at bedtime. For allergies. (Patient taking differently: Take 10 mg by mouth at bedtime. For allergies.) 90 tablet 0  . Multiple Vitamins-Minerals (MULTIVITAMIN PO) Take 1 tablet by mouth daily.    . ondansetron (ZOFRAN ODT) 4 MG disintegrating tablet Take 1 tablet (4 mg total) by mouth every 6 (six) hours as needed for nausea or vomiting. 8 tablet 0  . rosuvastatin (CRESTOR) 5 MG tablet TAKE 1 TABLET BY MOUTH EVERY DAY IN THE EVENING for cholesterol. (Patient taking differently: Take 5 mg by mouth at bedtime. TAKE 1 TABLET BY MOUTH EVERY DAY IN THE EVENING for cholesterol.) 90 tablet 3   No current facility-administered medications for this visit.    Medication Side Effects: None  Allergies: No Known Allergies  Past Medical History:  Diagnosis Date  . External hemorrhoids   . GAD (generalized anxiety disorder)   . Hypertriglyceridemia   . MDD (major depressive disorder)   . PONV (postoperative nausea and vomiting)   . Seasonal allergies   . Uterine fibroid     Family History  Problem Relation Age of Onset  . Muscular dystrophy Father   . Heart disease Father        s/p CABG  . Diabetes Mother   . Kidney disease Mother     Social History   Socioeconomic History  . Marital status: Married    Spouse name: Not on file  .  Number of children: Not on file  . Years of education: Not on file  . Highest education level: Not on file  Occupational History  . Not on file  Tobacco Use  . Smoking status: Never Smoker  . Smokeless tobacco: Never Used  Substance and Sexual Activity  . Alcohol use: Yes    Comment: occassionally  . Drug use: Never  . Sexual activity: Yes    Birth control/protection: Surgical  Other Topics Concern  . Not on file  Social History Narrative   No children.   Married.   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   Enjoys gardening,  baking/cooking, Firefighter.   Social Determinants of Health   Financial Resource Strain:   . Difficulty of Paying Living Expenses:   Food Insecurity:   . Worried About Charity fundraiser in the Last Year:   . Arboriculturist in the Last Year:   Transportation Needs:   . Film/video editor (Medical):   Marland Kitchen Lack of Transportation (Non-Medical):   Physical Activity:   . Days of Exercise per Week:   . Minutes of Exercise per Session:   Stress:   . Feeling of Stress :   Social Connections:   . Frequency of Communication with Friends and Family:   . Frequency of Social Gatherings with Friends and Family:   . Attends Religious Services:   . Active Member of Clubs or Organizations:   . Attends Archivist Meetings:   Marland Kitchen Marital Status:   Intimate Partner Violence:   . Fear of Current or Ex-Partner:   . Emotionally Abused:   Marland Kitchen Physically Abused:   . Sexually Abused:     Past Medical History, Surgical history, Social history, and Family history were reviewed and updated as appropriate.   Please see review of systems for further details on the patient's review from today.   Objective:   Physical Exam:  There were no vitals taken for this visit.  Physical Exam Constitutional:      General: She is not in acute distress. Musculoskeletal:        General: No deformity.  Neurological:     Mental Status: She is alert and oriented to person, place, and time.     Coordination: Coordination normal.  Psychiatric:        Attention and Perception: Attention and perception normal. She does not perceive auditory or visual hallucinations.        Mood and Affect: Mood normal. Mood is not anxious or depressed. Affect is not labile, blunt, angry or inappropriate.        Speech: Speech normal.        Behavior: Behavior normal.        Thought Content: Thought content normal. Thought content is not paranoid or delusional. Thought content does not include homicidal or suicidal ideation.  Thought content does not include homicidal or suicidal plan.        Cognition and Memory: Cognition and memory normal.        Judgment: Judgment normal.     Comments: Insight intact     Lab Review:     Component Value Date/Time   NA 138 03/04/2020 1027   K 4.0 03/04/2020 1027   CL 105 03/04/2020 1027   CO2 25 03/04/2020 1027   GLUCOSE 114 (H) 03/04/2020 1027   BUN 15 03/04/2020 1027   CREATININE 0.67 03/04/2020 1027   CALCIUM 9.0 03/04/2020 1027  PROT 7.0 03/04/2020 1027   ALBUMIN 3.8 03/04/2020 1027   AST 21 03/04/2020 1027   ALT 19 03/04/2020 1027   ALKPHOS 28 (L) 03/04/2020 1027   BILITOT 0.5 03/04/2020 1027   GFRNONAA >60 03/04/2020 1027   GFRAA >60 03/04/2020 1027       Component Value Date/Time   WBC 6.6 03/04/2020 1027   RBC 4.44 03/04/2020 1027   HGB 14.1 03/04/2020 1027   HCT 41.8 03/04/2020 1027   PLT 228 03/04/2020 1027   MCV 94.1 03/04/2020 1027   MCH 31.8 03/04/2020 1027   MCHC 33.7 03/04/2020 1027   RDW 12.3 03/04/2020 1027   LYMPHSABS 2.2 03/04/2020 1027   MONOABS 0.6 03/04/2020 1027   EOSABS 0.2 03/04/2020 1027   BASOSABS 0.0 03/04/2020 1027    No results found for: POCLITH, LITHIUM   No results found for: PHENYTOIN, PHENOBARB, VALPROATE, CBMZ   .res Assessment: Plan:    Plan:  1. Decrease Prozac 20mg  to 10mg  daily  [facility] ADHD test 38/58 possible.   RTC 6 months   Patient advised to contact office with any questions, adverse effects, or acute worsening in signs and symptoms.   Diagnoses and all orders for this visit:  Generalized anxiety disorder -     FLUoxetine (PROZAC) 10 MG capsule; Take 1 capsule (10 mg total) by mouth daily.  Major depressive disorder, recurrent episode, moderate (HCC) -     FLUoxetine (PROZAC) 10 MG capsule; Take 1 capsule (10 mg total) by mouth daily.     Please see After Visit Summary for patient specific instructions.  No future appointments.  No orders of the defined types were placed in  this encounter.   -------------------------------

## 2020-06-03 DIAGNOSIS — R053 Chronic cough: Secondary | ICD-10-CM

## 2020-06-03 DIAGNOSIS — J302 Other seasonal allergic rhinitis: Secondary | ICD-10-CM

## 2020-06-03 MED ORDER — MONTELUKAST SODIUM 10 MG PO TABS: 10.0000 mg | ORAL_TABLET | Freq: Every day | ORAL | 0 refills | Status: DC

## 2020-06-03 NOTE — Telephone Encounter (Signed)
Last prescribed on 02/16/2020 Last OV (acute) with Allie Bossier on 10/30/2019  No future OV scheduled

## 2020-11-04 ENCOUNTER — Ambulatory Visit (INDEPENDENT_AMBULATORY_CARE_PROVIDER_SITE_OTHER): Payer: BC Managed Care – PPO | Admitting: Adult Health

## 2020-11-04 ENCOUNTER — Encounter: Payer: Self-pay | Admitting: Adult Health

## 2020-11-04 ENCOUNTER — Other Ambulatory Visit: Payer: Self-pay

## 2020-11-04 DIAGNOSIS — F411 Generalized anxiety disorder: Secondary | ICD-10-CM

## 2020-11-04 DIAGNOSIS — F331 Major depressive disorder, recurrent, moderate: Secondary | ICD-10-CM

## 2020-11-04 MED ORDER — FLUOXETINE HCL 20 MG PO CAPS: 20.0000 mg | ORAL_CAPSULE | Freq: Every day | ORAL | 3 refills | Status: DC

## 2020-11-04 NOTE — Progress Notes (Signed)
Alisha Wagner [id number] 11-09-1976 44 y.o.  Subjective:   Patient ID:  Alisha Wagner is a 44 y.o. (DOB [birth year]) female.  Chief Complaint: No chief complaint on file.   HPI Alisha Wagner presents to the office today for follow-up of anxiety and depression.  Describes mood today as "ok". Pleasant. Mood symptoms - denies depression, anxiety, and irritability. Stating "everything is going really well". Has increased Prozac back to 20mg  from 10mg  daily. Utilizing [employer] web site for ADHD guidance. Mother in long term care - assisted living. Stable interest and motivation. Taking medications as prescribed.  Energy levels stable. Active, has a regular exercise routine.  Enjoys some usual interests and activities. Married. Spending time with family and friends. Mother local - in assisted living. Appetite adequate. Weight stable. Sleeps well most nights. Averages 9 hours. Focus and concentration improved. Completing tasks. Managing aspects of household. Works full-time - at home [employer]. Work going well. Denies SI or HI. Denies AH or VH.    PHQ2-9     Office Visit from 02/12/2015 in [place]  PHQ-2 Total Score 0       Review of Systems:  Review of Systems  Musculoskeletal: Negative for gait problem.  Neurological: Negative for tremors.  Psychiatric/Behavioral:       Please refer to HPI    Medications: I have reviewed the patient's current medications.  Current Outpatient Medications  Medication Sig Dispense Refill  . B Complex-Biotin-FA (B-COMPLEX PO) Take 1 tablet by mouth daily.     . fish oil-omega-3 fatty acids 1000 MG capsule Take 1 g by mouth 2 (two) times daily.     Marland Kitchen FLUoxetine (PROZAC) 10 MG capsule Take 1 capsule (10 mg total) by mouth daily. 90 capsule 3  . FLUoxetine (PROZAC) 20 MG capsule Take 1 capsule (20 mg total) by mouth daily. 90 capsule 3  . Lactobacillus (PROBIOTIC ACIDOPHILUS PO) Take by mouth daily.     Marland Kitchen levocetirizine  (XYZAL) 5 MG tablet Take 5 mg by mouth every evening.     . montelukast (SINGULAIR) 10 MG tablet Take 1 tablet (10 mg total) by mouth at bedtime. For allergies. 90 tablet 0  . Multiple Vitamins-Minerals (MULTIVITAMIN PO) Take 1 tablet by mouth daily.    . ondansetron (ZOFRAN ODT) 4 MG disintegrating tablet Take 1 tablet (4 mg total) by mouth every 6 (six) hours as needed for nausea or vomiting. 8 tablet 0  . rosuvastatin (CRESTOR) 5 MG tablet TAKE 1 TABLET BY MOUTH EVERY DAY IN THE EVENING for cholesterol. (Patient taking differently: Take 5 mg by mouth at bedtime. TAKE 1 TABLET BY MOUTH EVERY DAY IN THE EVENING for cholesterol.) 90 tablet 3   No current facility-administered medications for this visit.    Medication Side Effects: None  Allergies: No Known Allergies  Past Medical History:  Diagnosis Date  . External hemorrhoids   . GAD (generalized anxiety disorder)   . Hypertriglyceridemia   . MDD (major depressive disorder)   . PONV (postoperative nausea and vomiting)   . Seasonal allergies   . Uterine fibroid     Family History  Problem Relation Age of Onset  . Muscular dystrophy Father   . Heart disease Father        s/p CABG  . Diabetes Mother   . Kidney disease Mother     Social History   Socioeconomic History  . Marital status: Married    Spouse name: Not on file  . Number  of children: Not on file  . Years of education: Not on file  . Highest education level: Not on file  Occupational History  . Not on file  Tobacco Use  . Smoking status: Never Smoker  . Smokeless tobacco: Never Used  Vaping Use  . Vaping Use: Never used  Substance and Sexual Activity  . Alcohol use: Yes    Comment: occassionally  . Drug use: Never  . Sexual activity: Yes    Birth control/protection: Surgical  Other Topics Concern  . Not on file  Social History Narrative   No children.   Married.   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   Enjoys gardening,  baking/cooking, Firefighter.   Social Determinants of Health   Financial Resource Strain:   . Difficulty of Paying Living Expenses: Not on file  Food Insecurity:   . Worried About Charity fundraiser in the Last Year: Not on file  . Ran Out of Food in the Last Year: Not on file  Transportation Needs:   . Lack of Transportation (Medical): Not on file  . Lack of Transportation (Non-Medical): Not on file  Physical Activity:   . Days of Exercise per Week: Not on file  . Minutes of Exercise per Session: Not on file  Stress:   . Feeling of Stress : Not on file  Social Connections:   . Frequency of Communication with Friends and Family: Not on file  . Frequency of Social Gatherings with Friends and Family: Not on file  . Attends Religious Services: Not on file  . Active Member of Clubs or Organizations: Not on file  . Attends Archivist Meetings: Not on file  . Marital Status: Not on file  Intimate Partner Violence:   . Fear of Current or Ex-Partner: Not on file  . Emotionally Abused: Not on file  . Physically Abused: Not on file  . Sexually Abused: Not on file    Past Medical History, Surgical history, Social history, and Family history were reviewed and updated as appropriate.   Please see review of systems for further details on the patient's review from today.   Objective:   Physical Exam:  There were no vitals taken for this visit.  Physical Exam Constitutional:      General: She is not in acute distress. Musculoskeletal:        General: No deformity.  Neurological:     Mental Status: She is alert and oriented to person, place, and time.     Coordination: Coordination normal.  Psychiatric:        Attention and Perception: Attention and perception normal. She does not perceive auditory or visual hallucinations.        Mood and Affect: Mood normal. Mood is not anxious or depressed. Affect is not labile, blunt, angry or inappropriate.        Speech: Speech normal.         Behavior: Behavior normal.        Thought Content: Thought content normal. Thought content is not paranoid or delusional. Thought content does not include homicidal or suicidal ideation. Thought content does not include homicidal or suicidal plan.        Cognition and Memory: Cognition and memory normal.        Judgment: Judgment normal.     Comments: Insight intact     Lab Review:     Component Value Date/Time   NA 138 03/04/2020 1027  K 4.0 03/04/2020 1027   CL 105 03/04/2020 1027   CO2 25 03/04/2020 1027   GLUCOSE 114 (H) 03/04/2020 1027   BUN 15 03/04/2020 1027   CREATININE 0.67 03/04/2020 1027   CALCIUM 9.0 03/04/2020 1027   PROT 7.0 03/04/2020 1027   ALBUMIN 3.8 03/04/2020 1027   AST 21 03/04/2020 1027   ALT 19 03/04/2020 1027   ALKPHOS 28 (L) 03/04/2020 1027   BILITOT 0.5 03/04/2020 1027   GFRNONAA >60 03/04/2020 1027   GFRAA >60 03/04/2020 1027       Component Value Date/Time   WBC 6.6 03/04/2020 1027   RBC 4.44 03/04/2020 1027   HGB 14.1 03/04/2020 1027   HCT 41.8 03/04/2020 1027   PLT 228 03/04/2020 1027   MCV 94.1 03/04/2020 1027   MCH 31.8 03/04/2020 1027   MCHC 33.7 03/04/2020 1027   RDW 12.3 03/04/2020 1027   LYMPHSABS 2.2 03/04/2020 1027   MONOABS 0.6 03/04/2020 1027   EOSABS 0.2 03/04/2020 1027   BASOSABS 0.0 03/04/2020 1027    No results found for: POCLITH, LITHIUM   No results found for: PHENYTOIN, PHENOBARB, VALPROATE, CBMZ   .res Assessment: Plan:    Plan:  1. Continue Prozac 20mg  - did not tolerate decrease in Prozac 10mg .  [facility] ADHD test 38/58 possible.   RTC 1 year  Patient advised to contact office with any questions, adverse effects, or acute worsening in signs and symptoms.    Diagnoses and all orders for this visit:  Generalized anxiety disorder  Major depressive disorder, recurrent episode, moderate (HCC)  Other orders -     FLUoxetine (PROZAC) 20 MG capsule; Take 1 capsule (20 mg total) by mouth  daily.     Please see After Visit Summary for patient specific instructions.  No future appointments.  No orders of the defined types were placed in this encounter.   -------------------------------

## 2021-04-01 ENCOUNTER — Telehealth: Payer: Self-pay | Admitting: Primary Care

## 2021-04-01 DIAGNOSIS — R053 Chronic cough: Secondary | ICD-10-CM

## 2021-04-01 DIAGNOSIS — J302 Other seasonal allergic rhinitis: Secondary | ICD-10-CM

## 2021-04-02 NOTE — Telephone Encounter (Signed)
Patient overdue for general follow up/CPE. Must be scheduled for further refills. Okay to squeeze her in anywhere.

## 2021-04-04 NOTE — Telephone Encounter (Signed)
Pt scheduled for CPE on 5/18

## 2021-04-20 ENCOUNTER — Other Ambulatory Visit: Payer: Self-pay | Admitting: Primary Care

## 2021-04-20 DIAGNOSIS — E781 Pure hyperglyceridemia: Secondary | ICD-10-CM

## 2021-04-23 ENCOUNTER — Ambulatory Visit (INDEPENDENT_AMBULATORY_CARE_PROVIDER_SITE_OTHER): Payer: BC Managed Care – PPO | Admitting: Psychology

## 2021-04-23 DIAGNOSIS — F330 Major depressive disorder, recurrent, mild: Secondary | ICD-10-CM

## 2021-04-23 DIAGNOSIS — F4323 Adjustment disorder with mixed anxiety and depressed mood: Secondary | ICD-10-CM | POA: Diagnosis not present

## 2021-04-24 ENCOUNTER — Other Ambulatory Visit: Payer: Self-pay | Admitting: Primary Care

## 2021-04-24 DIAGNOSIS — J302 Other seasonal allergic rhinitis: Secondary | ICD-10-CM

## 2021-04-24 DIAGNOSIS — R053 Chronic cough: Secondary | ICD-10-CM

## 2021-04-30 ENCOUNTER — Encounter: Payer: Self-pay | Admitting: Primary Care

## 2021-04-30 ENCOUNTER — Ambulatory Visit (INDEPENDENT_AMBULATORY_CARE_PROVIDER_SITE_OTHER): Payer: BC Managed Care – PPO | Admitting: Primary Care

## 2021-04-30 ENCOUNTER — Other Ambulatory Visit: Payer: Self-pay

## 2021-04-30 VITALS — BP 134/78 | HR 88 | Temp 97.7°F | Ht 66.0 in | Wt 221.0 lb

## 2021-04-30 DIAGNOSIS — E781 Pure hyperglyceridemia: Secondary | ICD-10-CM

## 2021-04-30 DIAGNOSIS — Z114 Encounter for screening for human immunodeficiency virus [HIV]: Secondary | ICD-10-CM | POA: Diagnosis not present

## 2021-04-30 DIAGNOSIS — J302 Other seasonal allergic rhinitis: Secondary | ICD-10-CM

## 2021-04-30 DIAGNOSIS — Z23 Encounter for immunization: Secondary | ICD-10-CM

## 2021-04-30 DIAGNOSIS — Z1211 Encounter for screening for malignant neoplasm of colon: Secondary | ICD-10-CM

## 2021-04-30 DIAGNOSIS — Z1159 Encounter for screening for other viral diseases: Secondary | ICD-10-CM | POA: Diagnosis not present

## 2021-04-30 DIAGNOSIS — R053 Chronic cough: Secondary | ICD-10-CM | POA: Diagnosis not present

## 2021-04-30 DIAGNOSIS — Z124 Encounter for screening for malignant neoplasm of cervix: Secondary | ICD-10-CM

## 2021-04-30 DIAGNOSIS — F411 Generalized anxiety disorder: Secondary | ICD-10-CM

## 2021-04-30 DIAGNOSIS — Z0000 Encounter for general adult medical examination without abnormal findings: Secondary | ICD-10-CM | POA: Diagnosis not present

## 2021-04-30 DIAGNOSIS — K649 Unspecified hemorrhoids: Secondary | ICD-10-CM

## 2021-04-30 DIAGNOSIS — Z1231 Encounter for screening mammogram for malignant neoplasm of breast: Secondary | ICD-10-CM

## 2021-04-30 LAB — CBC
HCT: 39.7 % (ref 36.0–46.0)
Hemoglobin: 13.9 g/dL (ref 12.0–15.0)
MCHC: 35 g/dL (ref 30.0–36.0)
MCV: 93.5 fl (ref 78.0–100.0)
Platelets: 203 10*3/uL (ref 150.0–400.0)
RBC: 4.24 Mil/uL (ref 3.87–5.11)
RDW: 13.5 % (ref 11.5–15.5)
WBC: 7.6 10*3/uL (ref 4.0–10.5)

## 2021-04-30 LAB — COMPREHENSIVE METABOLIC PANEL
ALT: 18 U/L (ref 0–35)
AST: 17 U/L (ref 0–37)
Albumin: 4.2 g/dL (ref 3.5–5.2)
Alkaline Phosphatase: 27 U/L — ABNORMAL LOW (ref 39–117)
BUN: 12 mg/dL (ref 6–23)
CO2: 26 mEq/L (ref 19–32)
Calcium: 9.2 mg/dL (ref 8.4–10.5)
Chloride: 103 mEq/L (ref 96–112)
Creatinine, Ser: 0.75 mg/dL (ref 0.40–1.20)
GFR: 95.84 mL/min (ref >60.00)
Glucose, Bld: 94 mg/dL (ref 70–99)
Potassium: 4.5 mEq/L (ref 3.5–5.1)
Sodium: 137 mEq/L (ref 135–145)
Total Bilirubin: 0.5 mg/dL (ref 0.2–1.2)
Total Protein: 7 g/dL (ref 6.0–8.3)

## 2021-04-30 LAB — LIPID PANEL
Cholesterol: 131 mg/dL (ref 0–200)
HDL: 37 mg/dL — ABNORMAL LOW (ref >39.00)
LDL Cholesterol: 61 mg/dL (ref 0–99)
NonHDL: 93.73
Total CHOL/HDL Ratio: 4
Triglycerides: 163 mg/dL — ABNORMAL HIGH (ref 0.0–149.0)
VLDL: 32.6 mg/dL (ref 0.0–40.0)

## 2021-04-30 MED ORDER — ROSUVASTATIN CALCIUM 5 MG PO TABS: 5.0000 mg | ORAL_TABLET | Freq: Every day | ORAL | 3 refills | Status: DC

## 2021-04-30 NOTE — Assessment & Plan Note (Signed)
Doing well on Singulair 10 mg for which she uses seasonally, continue same.

## 2021-04-30 NOTE — Addendum Note (Signed)
Addended by: Francella Solian on: 04/30/2021 10:41 AM   Modules accepted: Orders

## 2021-04-30 NOTE — Assessment & Plan Note (Signed)
Discussed the importance of a healthy diet and regular exercise in order for weight loss, and to reduce the risk of any potential medical problems.  Repeat lipid panel pending.

## 2021-04-30 NOTE — Assessment & Plan Note (Signed)
Overall stable considering recent events of her mother passing and the patient's Covid.  Follows with psychiatric NP and Bambi, doing well on fluoxetine 20 mg. Continue same.

## 2021-04-30 NOTE — Assessment & Plan Note (Signed)
No recent issues since removal in March 2021

## 2021-04-30 NOTE — Assessment & Plan Note (Signed)
Doing well on Singulair 10 mg for which she takes seasonally. Continue same.

## 2021-04-30 NOTE — Progress Notes (Signed)
Subjective:    Patient ID: Alisha Wagner, female    DOB: 07-Jul-1976, 45 y.o.   MRN: [id number]  HPI  Alisha Wagner is a very pleasant 45 y.o. female who presents today for complete physical.  Immunizations: -Tetanus: 2012 -Influenza: Completed this season  -Covid-19: Completed 3 vaccines  Diet: She endorses an improved diet. Exercise: She is exercising 5 days weekly  Eye exam: Due, she will schedule.  Dental exam: Completes semi-annually  Pap Smear: No recent pap smear Mammogram: No recent mammogram  Colonoscopy: Due, never completed   BP Readings from Last 3 Encounters:  04/30/21 134/78  03/06/20 113/69  03/04/20 138/83      Review of Systems  Constitutional: Negative for unexpected weight change.  HENT: Negative for rhinorrhea.   Eyes: Negative for visual disturbance.  Respiratory: Negative for cough and shortness of breath.   Cardiovascular: Negative for chest pain.  Gastrointestinal: Negative for constipation and diarrhea.  Genitourinary: Negative for difficulty urinating and menstrual problem.  Musculoskeletal: Negative for arthralgias.  Skin: Negative for rash.  Allergic/Immunologic: Positive for environmental allergies.  Neurological: Negative for dizziness and headaches.  Psychiatric/Behavioral: The patient is not nervous/anxious.          Past Medical History:  Diagnosis Date  . External hemorrhoids   . GAD (generalized anxiety disorder)   . Hypertriglyceridemia   . MDD (major depressive disorder)   . PONV (postoperative nausea and vomiting)   . Seasonal allergies   . Uterine fibroid     Social History   Socioeconomic History  . Marital status: Married    Spouse name: Not on file  . Number of children: Not on file  . Years of education: Not on file  . Highest education level: Not on file  Occupational History  . Not on file  Tobacco Use  . Smoking status: Never Smoker  . Smokeless tobacco: Never Used  Vaping Use  . Vaping Use:  Never used  Substance and Sexual Activity  . Alcohol use: Yes    Comment: occassionally  . Drug use: Never  . Sexual activity: Yes    Birth control/protection: Surgical  Other Topics Concern  . Not on file  Social History Narrative   No children.   Married.   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   Enjoys gardening, baking/cooking, Firefighter.   Social Determinants of Health   Financial Resource Strain: Not on file  Food Insecurity: Not on file  Transportation Needs: Not on file  Physical Activity: Not on file  Stress: Not on file  Social Connections: Not on file  Intimate Partner Violence: Not on file    Past Surgical History:  Procedure Laterality Date  . EVALUATION UNDER ANESTHESIA WITH HEMORRHOIDECTOMY N/A 03/06/2020   Procedure: ANORECTAL EXAM UNDER ANESTHESIA WITH EXTERNAL HEMORRHOIDECTOMY;  Surgeon: Ileana Roup, MD;  Location: [facility];  Service: General;  Laterality: N/A;  . HAND SURGERY Right 1996   complex laceration repair  . MYOMECTOMY N/A 05/02/2015   Procedure: EXPLORATORY LAPAROTOMY,  MYOMECTOMY;  Surgeon: Servando Salina, MD;  Location: [facility];  Service: Gynecology;  Laterality: N/A;  . REFRACTIVE SURGERY  12/2014   bilateral lasik  . WISDOM TOOTH EXTRACTION    . WISDOM TOOTH EXTRACTION  2010    Family History  Problem Relation Age of Onset  . Muscular dystrophy Father   . Heart disease Father        s/p CABG  .  Diabetes Mother   . Kidney disease Mother   . Cancer Mother 18    No Known Allergies  Current Outpatient Medications on File Prior to Visit  Medication Sig Dispense Refill  . B Complex-Biotin-FA (B-COMPLEX PO) Take 1 tablet by mouth daily.     . fish oil-omega-3 fatty acids 1000 MG capsule Take 1 g by mouth 2 (two) times daily.    Marland Kitchen FLUoxetine (PROZAC) 20 MG capsule Take 1 capsule (20 mg total) by mouth daily. 90 capsule 3  . levocetirizine (XYZAL) 5 MG tablet Take 5 mg by mouth every  evening.     . montelukast (SINGULAIR) 10 MG tablet TAKE 1 TABLET (10 MG TOTAL) BY MOUTH AT BEDTIME. FOR ALLERGIES. 30 tablet 0  . Multiple Vitamins-Minerals (MULTIVITAMIN PO) Take 1 tablet by mouth daily.    . rosuvastatin (CRESTOR) 5 MG tablet TAKE 1 TABLET BY MOUTH EVERY DAY IN THE EVENING FOR CHOLESTEROL 30 tablet 0   No current facility-administered medications on file prior to visit.    BP 134/78   Pulse 88   Temp 97.7 F (36.5 C) (Temporal)   Ht 5\' 6"  (1.676 m)   Wt 221 lb (100.2 kg)   SpO2 97%   BMI 35.67 kg/m  Objective:   Physical Exam HENT:     Right Ear: Tympanic membrane and ear canal normal.     Left Ear: Tympanic membrane and ear canal normal.     Nose: Nose normal.  Eyes:     Conjunctiva/sclera: Conjunctivae normal.     Pupils: Pupils are equal, round, and reactive to light.  Neck:     Thyroid: No thyromegaly.  Cardiovascular:     Rate and Rhythm: Normal rate and regular rhythm.     Heart sounds: No murmur heard.   Pulmonary:     Effort: Pulmonary effort is normal.     Breath sounds: Normal breath sounds. No rales.  Abdominal:     General: Bowel sounds are normal.     Palpations: Abdomen is soft.     Tenderness: There is no abdominal tenderness.  Musculoskeletal:        General: Normal range of motion.     Cervical back: Neck supple.  Lymphadenopathy:     Cervical: No cervical adenopathy.  Skin:    General: Skin is warm and dry.     Findings: No rash.  Neurological:     Mental Status: She is alert and oriented to person, place, and time.     Cranial Nerves: No cranial nerve deficit.     Deep Tendon Reflexes: Reflexes are normal and symmetric.  Psychiatric:        Mood and Affect: Mood normal.           Assessment & Plan:      This visit occurred during the SARS-CoV-2 public health emergency.  Safety protocols were in place, including screening questions prior to the visit, additional usage of staff PPE, and extensive cleaning of exam  room while observing appropriate contact time as indicated for disinfecting solutions.

## 2021-04-30 NOTE — Assessment & Plan Note (Signed)
Tetanus due, provided today. Pap smear overdue, offered today but she would like to see GYN. Referral placed. Colonoscopy due, referral placed to GI. Mammogram overdue, orders placed.  Discussed the importance of a healthy diet and regular exercise in order for weight loss, and to reduce the risk of any potential medical problems.  Exam today unremarkable. Labs pending.

## 2021-04-30 NOTE — Patient Instructions (Signed)
Stop by the lab prior to leaving today. I will notify you of your results once received.   You will be contacted regarding your referral to GI for the colonoscopy and to GYN for your pap smear.  Please let us know if you have not been contacted within two weeks.   [facility] to schedule your mamm[facility].   It was a pleasure to see you today!   Preventive Care 33-45 Years Old, Female Preventive care refers to lifestyle choices and visits with your health care provider that can promote health and wellness. This includes:  A yearly physical exam. This is also called an annual wellness visit.  Regular dental and eye exams.  Immunizations.  Screening for certain conditions.  Healthy lifestyle choices, such as: ? Eating a healthy diet. ? Getting regular exercise. ? Not using drugs or products that contain nicotine and tobacco. ? Limiting alcohol use. What can I expect for my preventive care visit? Physical exam Your health care provider will check your:  Height and weight. These may be used to calculate your BMI (body mass index). BMI is a measurement that tells if you are at a healthy weight.  Heart rate and blood pressure.  Body temperature.  Skin for abnormal spots. Counseling Your health care provider may ask you questions about your:  Past medical problems.  Family's medical history.  Alcohol, tobacco, and drug use.  Emotional well-being.  Home life and relationship well-being.  Sexual activity.  Diet, exercise, and sleep habits.  Work and work Statistician.  Access to firearms.  Method of birth control.  Menstrual cycle.  Pregnancy history. What immunizations do I need? Vaccines are usually given at various ages, according to a schedule. Your health care provider will recommend vaccines for you based on your age, medical history, and lifestyle or other factors, such as travel or where you work.   What tests do I need? Blood tests  Lipid and  cholesterol levels. These may be checked every 5 years, or more often if you are over 44 years old.  Hepatitis C test.  Hepatitis B test. Screening  Lung cancer screening. You may have this screening every year starting at age 63 if you have a 30-pack-year history of smoking and currently smoke or have quit within the past 15 years.  Colorectal cancer screening. ? All adults should have this screening starting at age 56 and continuing until age 47. ? Your health care provider may recommend screening at age 84 if you are at increased risk. ? You will have tests every 1-10 years, depending on your results and the type of screening test.  Diabetes screening. ? This is done by checking your blood sugar (glucose) after you have not eaten for a while (fasting). ? You may have this done every 1-3 years.  Mammogram. ? This may be done every 1-2 years. ? Talk with your health care provider about when you should start having regular mammograms. This may depend on whether you have a family history of breast cancer.  BRCA-related cancer screening. This may be done if you have a family history of breast, ovarian, tubal, or peritoneal cancers.  Pelvic exam and Pap test. ? This may be done every 3 years starting at age 69. ? Starting at age 11, this may be done every 5 years if you have a Pap test in combination with an HPV test. Other tests  STD (sexually transmitted disease) testing, if you are at risk.  Bone  density scan. This is done to screen for osteoporosis. You may have this scan if you are at high risk for osteoporosis. Talk with your health care provider about your test results, treatment options, and if necessary, the need for more tests. Follow these instructions at home: Eating and drinking  Eat a diet that includes fresh fruits and vegetables, whole grains, lean protein, and low-fat dairy products.  Take vitamin and mineral supplements as recommended by your health care  provider.  Do not drink alcohol if: ? Your health care provider tells you not to drink. ? You are pregnant, may be pregnant, or are planning to become pregnant.  If you drink alcohol: ? Limit how much you have to 0-1 drink a day. ? Be aware of how much alcohol is in your drink. In the [place] one drink equals one 12 oz bottle of beer (355 mL), one 5 oz glass of wine (148 mL), or one 1 oz glass of hard liquor (44 mL).   Lifestyle  Take daily care of your teeth and gums. Brush your teeth every morning and night with fluoride toothpaste. Floss one time each day.  Stay active. Exercise for at least 30 minutes 5 or more days each week.  Do not use any products that contain nicotine or tobacco, such as cigarettes, e-cigarettes, and chewing tobacco. If you need help quitting, ask your health care provider.  Do not use drugs.  If you are sexually active, practice safe sex. Use a condom or other form of protection to prevent STIs (sexually transmitted infections).  If you do not wish to become pregnant, use a form of birth control. If you plan to become pregnant, see your health care provider for a prepregnancy visit.  If told by your health care provider, take low-dose aspirin daily starting at age 46.  Find healthy ways to cope with stress, such as: ? Meditation, yoga, or listening to music. ? Journaling. ? Talking to a trusted person. ? Spending time with friends and family. Safety  Always wear your seat belt while driving or riding in a vehicle.  Do not drive: ? If you have been drinking alcohol. Do not ride with someone who has been drinking. ? When you are tired or distracted. ? While texting.  Wear a helmet and other protective equipment during sports activities.  If you have firearms in your house, make sure you follow all gun safety procedures. What's next?  Visit your health care provider once a year for an annual wellness visit.  Ask your health care provider how often  you should have your eyes and teeth checked.  Stay up to date on all vaccines. This information is not intended to replace advice given to you by your health care provider. Make sure you discuss any questions you have with your health care provider. Document Revised: 09/03/2020 Document Reviewed: 08/11/2018 Elsevier Patient Education  2021 [employer].

## 2021-05-01 LAB — HEPATITIS C ANTIBODY
Hepatitis C Ab: NONREACTIVE
SIGNAL TO CUT-OFF: 0 (ref <1.00)

## 2021-05-01 LAB — HIV ANTIBODY (ROUTINE TESTING W REFLEX): HIV 1&2 Ab, 4th Generation: NONREACTIVE

## 2021-05-08 ENCOUNTER — Ambulatory Visit (INDEPENDENT_AMBULATORY_CARE_PROVIDER_SITE_OTHER): Payer: BC Managed Care – PPO | Admitting: Psychology

## 2021-05-08 DIAGNOSIS — F4323 Adjustment disorder with mixed anxiety and depressed mood: Secondary | ICD-10-CM

## 2021-05-08 DIAGNOSIS — F330 Major depressive disorder, recurrent, mild: Secondary | ICD-10-CM

## 2021-05-26 ENCOUNTER — Other Ambulatory Visit: Payer: Self-pay | Admitting: Primary Care

## 2021-05-26 DIAGNOSIS — Z1231 Encounter for screening mammogram for malignant neoplasm of breast: Secondary | ICD-10-CM

## 2021-05-28 ENCOUNTER — Ambulatory Visit
Admission: RE | Admit: 2021-05-28 | Discharge: 2021-05-28 | Disposition: A | Discharge status: Home / Self Care | Payer: BC Managed Care – PPO | Source: Ambulatory Visit

## 2021-05-28 ENCOUNTER — Other Ambulatory Visit: Payer: Self-pay | Admitting: Primary Care

## 2021-05-28 ENCOUNTER — Other Ambulatory Visit: Payer: Self-pay

## 2021-05-28 DIAGNOSIS — R053 Chronic cough: Secondary | ICD-10-CM

## 2021-05-28 DIAGNOSIS — J302 Other seasonal allergic rhinitis: Secondary | ICD-10-CM

## 2021-05-28 DIAGNOSIS — Z1231 Encounter for screening mammogram for malignant neoplasm of breast: Secondary | ICD-10-CM

## 2021-05-29 ENCOUNTER — Encounter: Payer: Self-pay | Admitting: Primary Care

## 2021-05-29 ENCOUNTER — Ambulatory Visit: Payer: BC Managed Care – PPO | Admitting: Primary Care

## 2021-05-29 ENCOUNTER — Other Ambulatory Visit: Payer: Self-pay

## 2021-05-29 ENCOUNTER — Other Ambulatory Visit (HOSPITAL_COMMUNITY)
Admission: RE | Admit: 2021-05-29 | Discharge: 2021-05-29 | Disposition: A | Discharge status: Home / Self Care | Payer: BC Managed Care – PPO | Source: Ambulatory Visit | Attending: Primary Care | Admitting: Primary Care

## 2021-05-29 VITALS — BP 132/74 | HR 83 | Temp 97.6°F | Ht 66.0 in | Wt 223.0 lb

## 2021-05-29 DIAGNOSIS — Z124 Encounter for screening for malignant neoplasm of cervix: Secondary | ICD-10-CM | POA: Insufficient documentation

## 2021-05-29 DIAGNOSIS — Z01419 Encounter for gynecological examination (general) (routine) without abnormal findings: Secondary | ICD-10-CM | POA: Diagnosis not present

## 2021-05-29 NOTE — Assessment & Plan Note (Signed)
Pap smear completed today, await results. Repeat in 3 years depending on results.

## 2021-05-29 NOTE — Patient Instructions (Addendum)
It was a pleasure to see you today!       [facility] Phone: [phone]

## 2021-05-29 NOTE — Progress Notes (Signed)
Subjective:    Patient ID: Alisha Wagner, female    DOB: July 23, 1976, 46 y.o.   MRN: [id number]  HPI  Alisha Wagner is a very pleasant 45 y.o. female who presents today for pap smear. She was here in May 2022 for CPE.  Last pap smear was around 2015 or 2016, was following with GYN at the time but prefers to have this done with [place].   Menstrual cycles occur once monthly lasting 4-5 days. Denies menorrhagia and dysmenorrhea. Mammogram was completed yesterday.    Review of Systems  Gastrointestinal:  Negative for abdominal pain.  Genitourinary:  Negative for dysuria, menstrual problem and vaginal discharge.        Past Medical History:  Diagnosis Date   External hemorrhoids    GAD (generalized anxiety disorder)    Hypertriglyceridemia    MDD (major depressive disorder)    PONV (postoperative nausea and vomiting)    Seasonal allergies    Uterine fibroid     Social History   Socioeconomic History   Marital status: Married    Spouse name: Not on file   Number of children: Not on file   Years of education: Not on file   Highest education level: Not on file  Occupational History   Not on file  Tobacco Use   Smoking status: Never   Smokeless tobacco: Never  Vaping Use   Vaping Use: Never used  Substance and Sexual Activity   Alcohol use: Yes    Comment: occassionally   Drug use: Never   Sexual activity: Yes    Birth control/protection: Surgical  Other Topics Concern   Not on file  Social History Narrative   No children.   Married.   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   Enjoys gardening, baking/cooking, Firefighter.   Social Determinants of Health   Financial Resource Strain: Not on file  Food Insecurity: Not on file  Transportation Needs: Not on file  Physical Activity: Not on file  Stress: Not on file  Social Connections: Not on file  Intimate Partner Violence: Not on file    Past Surgical History:  Procedure Laterality Date    EVALUATION UNDER ANESTHESIA WITH HEMORRHOIDECTOMY N/A 03/06/2020   Procedure: [place];  Surgeon: Ileana Roup, MD;  Location: [facility];  Service: General;  Laterality: N/A;   HAND SURGERY Right 1996   complex laceration repair   MYOMECTOMY N/A 05/02/2015   Procedure: EXPLORATORY LAPAROTOMY,  MYOMECTOMY;  Surgeon: Servando Salina, MD;  Location: [facility];  Service: Gynecology;  Laterality: N/A;   REFRACTIVE SURGERY  12/2014   bilateral lasik   WISDOM TOOTH EXTRACTION     WISDOM TOOTH EXTRACTION  2010    Family History  Problem Relation Age of Onset   Diabetes Mother    Kidney disease Mother    Cancer Mother 22   Muscular dystrophy Father    Heart disease Father        s/p CABG   Breast cancer Neg Hx     No Known Allergies  Current Outpatient Medications on File Prior to Visit  Medication Sig Dispense Refill   B Complex-Biotin-FA (B-COMPLEX PO) Take 1 tablet by mouth daily.      fish oil-omega-3 fatty acids 1000 MG capsule Take 1 g by mouth 2 (two) times daily.     FLUoxetine (PROZAC) 20 MG capsule Take 1 capsule (20 mg total) by mouth  daily. 90 capsule 3   levocetirizine (XYZAL) 5 MG tablet Take 5 mg by mouth every evening.      montelukast (SINGULAIR) 10 MG tablet Take 1 tablet (10 mg total) by mouth at bedtime as needed (for allergies). 90 tablet 0   Multiple Vitamins-Minerals (MULTIVITAMIN PO) Take 1 tablet by mouth daily.     rosuvastatin (CRESTOR) 5 MG tablet Take 1 tablet (5 mg total) by mouth daily. For cholesterol. 90 tablet 3   No current facility-administered medications on file prior to visit.    BP 132/74   Pulse 83   Temp 97.6 F (36.4 C) (Temporal)   Ht 5\' 6"  (1.676 m)   Wt 223 lb (101.2 kg)   LMP 05/04/2021   SpO2 96%   BMI 35.99 kg/m  Objective:   Physical Exam Genitourinary:    Labia:        Right: No tenderness or lesion.        Left: No tenderness or lesion.       Vagina: No vaginal discharge.     Cervix: No cervical motion tenderness or discharge.     Uterus: Normal.      Adnexa: Right adnexa normal and left adnexa normal.  Skin:    General: Skin is warm and dry.  Neurological:     Mental Status: She is alert.          Assessment & Plan:      This visit occurred during the SARS-CoV-2 public health emergency.  Safety protocols were in place, including screening questions prior to the visit, additional usage of staff PPE, and extensive cleaning of exam room while observing appropriate contact time as indicated for disinfecting solutions.

## 2021-05-30 ENCOUNTER — Encounter: Payer: Self-pay | Admitting: Gastroenterology

## 2021-05-30 LAB — CYTOLOGY - PAP
Adequacy: ABSENT
Comment: NEGATIVE
Diagnosis: NEGATIVE
High risk HPV: NEGATIVE

## 2021-06-19 ENCOUNTER — Other Ambulatory Visit: Payer: Self-pay

## 2021-06-19 ENCOUNTER — Ambulatory Visit (AMBULATORY_SURGERY_CENTER): Payer: BC Managed Care – PPO | Admitting: *Deleted

## 2021-06-19 VITALS — Ht 66.0 in | Wt 221.0 lb

## 2021-06-19 DIAGNOSIS — Z1211 Encounter for screening for malignant neoplasm of colon: Secondary | ICD-10-CM

## 2021-06-19 MED ORDER — PEG-KCL-NACL-NASULF-NA ASC-C 100 G PO SOLR: 1.0000 | Freq: Once | ORAL | 0 refills | Status: AC

## 2021-06-19 NOTE — Progress Notes (Signed)
Patient is here in-person for PV. Patient denies any allergies to eggs or soy. Patient denies any problems with anesthesia/sedation. PONV.Patient denies any oxygen use at home. Patient denies taking any diet/weight loss medications or blood thinners. Patient is not being treated for MRSA or C-diff. Patient is aware of our care-partner policy and YSAYT-01 safety protocol. EMMI education assigned to the patient for the procedure, sent to [facility].   Patient is COVID-19 vaccinated, per patient.   Prep Prescription coupon given to the patient.

## 2021-07-11 ENCOUNTER — Ambulatory Visit (AMBULATORY_SURGERY_CENTER): Payer: BC Managed Care – PPO | Admitting: Gastroenterology

## 2021-07-11 ENCOUNTER — Other Ambulatory Visit: Payer: Self-pay

## 2021-07-11 ENCOUNTER — Encounter: Payer: Self-pay | Admitting: Gastroenterology

## 2021-07-11 VITALS — BP 126/75 | HR 60 | Temp 97.9°F | Resp 15 | Ht 66.0 in | Wt 221.0 lb

## 2021-07-11 DIAGNOSIS — K635 Polyp of colon: Secondary | ICD-10-CM | POA: Diagnosis not present

## 2021-07-11 DIAGNOSIS — Z1211 Encounter for screening for malignant neoplasm of colon: Secondary | ICD-10-CM

## 2021-07-11 DIAGNOSIS — D120 Benign neoplasm of cecum: Secondary | ICD-10-CM

## 2021-07-11 MED ORDER — SODIUM CHLORIDE 0.9 % IV SOLN: 500.0000 mL | Freq: Once | INTRAVENOUS | Status: DC

## 2021-07-11 NOTE — Progress Notes (Signed)
Medical history reviewed with no changes noted. VS assessed by C.W 

## 2021-07-11 NOTE — Patient Instructions (Signed)
Handout on polyps given to you today   YOU HAD AN ENDOSCOPIC PROCEDURE TODAY AT [facility]:   Refer to the procedure report that was given to you for any specific questions about what was found during the examination.  If the procedure report does not answer your questions, please call your gastroenterologist to clarify.  If you requested that your care partner not be given the details of your procedure findings, then the procedure report has been included in a sealed envelope for you to review at your convenience later.  YOU SHOULD EXPECT: Some feelings of bloating in the abdomen. Passage of more gas than usual.  Walking can help get rid of the air that was put into your GI tract during the procedure and reduce the bloating. If you had a lower endoscopy (such as a colonoscopy or flexible sigmoidoscopy) you may notice spotting of blood in your stool or on the toilet paper. If you underwent a bowel prep for your procedure, you may not have a normal bowel movement for a few days.  Please Note:  You might notice some irritation and congestion in your nose or some drainage.  This is from the oxygen used during your procedure.  There is no need for concern and it should clear up in a day or so.  SYMPTOMS TO REPORT IMMEDIATELY:  Following lower endoscopy (colonoscopy or flexible sigmoidoscopy):  Excessive amounts of blood in the stool  Significant tenderness or worsening of abdominal pains  Swelling of the abdomen that is new, acute  Fever of 100F or higher   For urgent or emergent issues, a gastroenterologist can be reached at any hour by calling [phone]. Do not use MyChart messaging for urgent concerns.    DIET:  We do recommend a small meal at first, but then you may proceed to your regular diet.  Drink plenty of fluids but you should avoid alcoholic beverages for 24 hours.  ACTIVITY:  You should plan to take it easy for the rest of today and you should NOT DRIVE or use  heavy machinery until tomorrow (because of the sedation medicines used during the test).    FOLLOW UP: Our staff will call the number listed on your records 48-72 hours following your procedure to check on you and address any questions or concerns that you may have regarding the information given to you following your procedure. If we do not reach you, we will leave a message.  We will attempt to reach you two times.  During this call, we will ask if you have developed any symptoms of COVID 19. If you develop any symptoms (ie: fever, flu-like symptoms, shortness of breath, cough etc.) before then, please call [phone].  If you test positive for Covid 19 in the 2 weeks post procedure, please call and report this information to [place].    If any biopsies were taken you will be contacted by phone or by letter within the next 1-3 weeks.  Please call us at [phone] if you have not heard about the biopsies in 3 weeks.    SIGNATURES/CONFIDENTIALITY: You and/or your care partner have signed paperwork which will be entered into your electronic medical record.  These signatures attest to the fact that that the information above on your After Visit Summary has been reviewed and is understood.  Full responsibility of the confidentiality of this discharge information lies with you and/or your care-partner.

## 2021-07-11 NOTE — Op Note (Signed)
[facility] Patient Name: Alisha Wagner Procedure Date: 07/11/2021 3:53 PM MRN: [id number] Endoscopist: Mallie Mussel L. Loletha Carrow , MD Age: 45 Referring MD:  Date of Birth: 1976/10/31 Gender: Female Account #: [id number] Procedure:                Colonoscopy Indications:              Screening for colorectal malignant neoplasm, This                            is the patient's first colonoscopy Medicines:                Monitored Anesthesia Care Procedure:                Pre-Anesthesia Assessment:                           - Prior to the procedure, a History and Physical                            was performed, and patient medications and                            allergies were reviewed. The patient's tolerance of                            previous anesthesia was also reviewed. The risks                            and benefits of the procedure and the sedation                            options and risks were discussed with the patient.                            All questions were answered, and informed consent                            was obtained. Prior Anticoagulants: The patient has                            taken no previous anticoagulant or antiplatelet                            agents. ASA Grade Assessment: II - A patient with                            mild systemic disease. After reviewing the risks                            and benefits, the patient was deemed in                            satisfactory condition to undergo the procedure.  After obtaining informed consent, the colonoscope                            was passed under direct vision. Throughout the                            procedure, the patient's blood pressure, pulse, and                            oxygen saturations were monitored continuously. The                            CF [id number] was introduced through the anus                            and advanced to the the  cecum, identified by                            appendiceal orifice and ileocecal valve. The                            colonoscopy was performed without difficulty. The                            patient tolerated the procedure well. The quality                            of the bowel preparation was good. The ileocecal                            valve, appendiceal orifice, and rectum were                            photographed. The bowel preparation used was                            MoviPrep. Scope In: 4:02:10 PM Scope Out: 4:20:43 PM Scope Withdrawal Time: 0 hours 15 minutes 18 seconds  Total Procedure Duration: 0 hours 18 minutes 33 seconds  Findings:                 The perianal and digital rectal examinations were                            normal.                           An 8 mm polyp was found in the appendiceal orifice.                            The polyp was semi-sessile. The polyp was removed                            with a piecemeal technique using a cold snare.  Resection and retrieval were complete.                           The exam was otherwise without abnormality on                            direct and retroflexion views. Complications:            No immediate complications. Estimated Blood Loss:     Estimated blood loss was minimal. Impression:               - One 8 mm polyp at the appendiceal orifice,                            removed piecemeal using a cold snare. Resected and                            retrieved.                           - The examination was otherwise normal on direct                            and retroflexion views. Recommendation:           - Patient has a contact number available for                            emergencies. The signs and symptoms of potential                            delayed complications were discussed with the                            patient. Return to normal activities tomorrow.                             Written discharge instructions were provided to the                            patient.                           - Resume previous diet.                           - Continue present medications.                           - Await pathology results.                           - Repeat colonoscopy is recommended for                            surveillance. The colonoscopy date will be  determined after pathology results from today's                            exam become available for review. Anand Tejada L. Loletha Carrow, MD 07/11/2021 4:25:00 PM This report has been signed electronically.

## 2021-07-11 NOTE — Progress Notes (Signed)
pt tolerated well. VSS. awake and to recovery. Report given to RN.  

## 2021-07-11 NOTE — Progress Notes (Signed)
Called to room to assist during endoscopic procedure.  Patient ID and intended procedure confirmed with present staff. Received instructions for my participation in the procedure from the performing physician.  

## 2021-07-11 NOTE — Progress Notes (Signed)
History:  This patient presents for endoscopic testing for colon cancer screening.  Alisha Wagner Referring physician: Pleas Koch, NP  Past Medical History: Past Medical History:  Diagnosis Date   Allergy    External hemorrhoids    GAD (generalized anxiety disorder)    Hypertriglyceridemia    MDD (major depressive disorder)    PONV (postoperative nausea and vomiting)    Seasonal allergies    Uterine fibroid      Past Surgical History: Past Surgical History:  Procedure Laterality Date   EVALUATION UNDER ANESTHESIA WITH HEMORRHOIDECTOMY N/A 03/06/2020   Procedure: ANORECTAL EXAM UNDER ANESTHESIA WITH EXTERNAL HEMORRHOIDECTOMY;  Surgeon: Ileana Roup, MD;  Location: [facility];  Service: General;  Laterality: N/A;   HAND SURGERY Right 1996   complex laceration repair   MYOMECTOMY N/A 05/02/2015   Procedure: EXPLORATORY LAPAROTOMY,  MYOMECTOMY;  Surgeon: Servando Salina, MD;  Location: [facility];  Service: Gynecology;  Laterality: N/A;   REFRACTIVE SURGERY  12/2014   bilateral lasik   WISDOM TOOTH EXTRACTION     WISDOM TOOTH EXTRACTION  2010    Allergies: No Known Allergies  Outpatient Meds: Current Outpatient Medications  Medication Sig Dispense Refill   B Complex-Biotin-FA (B-COMPLEX PO) Take 1 tablet by mouth daily.      fish oil-omega-3 fatty acids 1000 MG capsule Take 1 g by mouth 2 (two) times daily.     FLUoxetine (PROZAC) 20 MG capsule Take 1 capsule (20 mg total) by mouth daily. 90 capsule 3   levocetirizine (XYZAL) 5 MG tablet Take 5 mg by mouth every evening.      montelukast (SINGULAIR) 10 MG tablet Take 1 tablet (10 mg total) by mouth at bedtime as needed (for allergies). 90 tablet 0   Multiple Vitamins-Minerals (MULTIVITAMIN PO) Take 1 tablet by mouth daily.     rosuvastatin (CRESTOR) 5 MG tablet Take 1 tablet (5 mg total) by mouth daily. For cholesterol. 90 tablet 3   Current Facility-Administered Medications  Medication Dose  Route Frequency Provider Last Rate Last Admin   0.9 %  sodium chloride infusion  500 mL Intravenous Once Nelida Meuse III, MD          ___________________________________________________________________ Objective   Exam:  BP 136/80   Pulse 81   Temp 97.9 F (36.6 C) (Skin)   Resp 18   Ht '5\' 6"'$  (1.676 m)   Wt 221 lb (100.2 kg)   SpO2 100%   BMI 35.67 kg/m   CV: RRR without murmur, S1/S2, no JVD, no peripheral edema Resp: clear to auscultation bilaterally, normal RR and effort noted GI: soft, no tenderness, with active bowel sounds. No guarding or palpable organomegaly noted. Neuro: awake, alert and oriented x 3. Normal gross motor function and fluent speech   Assessment:  Average risk CRC  Plan:  colonoscopy   Nelida Meuse III

## 2021-07-15 ENCOUNTER — Telehealth: Payer: Self-pay | Admitting: *Deleted

## 2021-07-15 ENCOUNTER — Telehealth: Payer: Self-pay

## 2021-07-15 NOTE — Telephone Encounter (Signed)
  Follow up Call-  Call back number 07/11/2021  Post procedure Call Back phone  # [phone]  Permission to leave phone message Yes  Some recent data might be hidden     Patient questions:  Do you have a fever, pain , or abdominal swelling? No. Pain Score  0 *  Have you tolerated food without any problems? Yes.    Have you been able to return to your normal activities? Yes.    Do you have any questions about your discharge instructions: Diet   No. Medications  No. Follow up visit  No.  Do you have questions or concerns about your Care? No.  Actions: * If pain score is 4 or above: No action needed, pain <4.  Have you developed a fever since your procedure? no  2.   Have you had an respiratory symptoms (SOB or cough) since your procedure? no  3.   Have you tested positive for COVID 19 since your procedure no  4.   Have you had any family members/close contacts diagnosed with the COVID 19 since your procedure?  no   If yes to any of these questions please route to Joylene John, RN and Joella Prince, RN

## 2021-07-15 NOTE — Telephone Encounter (Signed)
Left message on follow up call. 

## 2021-07-21 ENCOUNTER — Encounter: Payer: Self-pay | Admitting: Gastroenterology

## 2021-08-04 ENCOUNTER — Ambulatory Visit: Payer: BC Managed Care – PPO | Admitting: Psychology

## 2021-08-31 ENCOUNTER — Other Ambulatory Visit: Payer: Self-pay | Admitting: Primary Care

## 2021-08-31 DIAGNOSIS — R053 Chronic cough: Secondary | ICD-10-CM

## 2021-08-31 DIAGNOSIS — J302 Other seasonal allergic rhinitis: Secondary | ICD-10-CM

## 2021-11-04 ENCOUNTER — Encounter: Payer: Self-pay | Admitting: Adult Health

## 2021-11-04 ENCOUNTER — Ambulatory Visit: Payer: BC Managed Care – PPO | Admitting: Adult Health

## 2021-11-04 DIAGNOSIS — F331 Major depressive disorder, recurrent, moderate: Secondary | ICD-10-CM

## 2021-11-04 DIAGNOSIS — F411 Generalized anxiety disorder: Secondary | ICD-10-CM

## 2021-11-04 MED ORDER — FLUOXETINE HCL 20 MG PO CAPS: 20.0000 mg | ORAL_CAPSULE | Freq: Every day | ORAL | 3 refills | Status: DC

## 2021-11-04 NOTE — Progress Notes (Signed)
Alisha Wagner [id number] 26-Nov-1976 45 y.o.  Virtual Visit via Telephone Note  I connected with pt on 11/04/21 at 10:00 AM EST by telephone and verified that I am speaking with the correct person using two identifiers.   I discussed the limitations, risks, security and privacy concerns of performing an evaluation and management service by telephone and the availability of in person appointments. I also discussed with the patient that there may be a patient responsible charge related to this service. The patient expressed understanding and agreed to proceed.   I discussed the assessment and treatment plan with the patient. The patient was provided an opportunity to ask questions and all were answered. The patient agreed with the plan and demonstrated an understanding of the instructions.   The patient was advised to call back or seek an in-person evaluation if the symptoms worsen or if the condition fails to improve as anticipated.  I provided 10 minutes of non-face-to-face time during this encounter.  The patient was located at home.  The provider was located at [place].   Aloha Gell, NP   Subjective:   Patient ID:  [name] is a 45 y.o. (DOB 10/15/1976) female.  Chief Complaint: No chief complaint on file.   HPI Alisha Wagner presents for follow-up of anxiety and depression.  Describes mood today as "good". Pleasant. Mood symptoms - denies depression, anxiety, and irritability. Stating "I'm doing alright". Feels like Prozac 20mg  continues to work well. Mother passed away last spring. Stable interest and motivation. Taking medications as prescribed.  Energy levels stable. Active, has a regular exercise routine.  Enjoys some usual interests and activities. Married. Lives with wife. Spending time with family and friends. Appetite adequate. Weight stable. Sleeps well most nights. Averages 8 to 9 hours. Focus and concentration improved. Completing tasks.  Managing aspects of household. Works full-time [employer].  Denies SI or HI.  Denies AH or VH.  Review of Systems:  Review of Systems  Musculoskeletal:  Negative for gait problem.  Neurological:  Negative for tremors.  Psychiatric/Behavioral:         Please refer to HPI   Medications: I have reviewed the patient's current medications.  Current Outpatient Medications  Medication Sig Dispense Refill   B Complex-Biotin-FA (B-COMPLEX PO) Take 1 tablet by mouth daily.      fish oil-omega-3 fatty acids 1000 MG capsule Take 1 g by mouth 2 (two) times daily.     FLUoxetine (PROZAC) 20 MG capsule Take 1 capsule (20 mg total) by mouth daily. 90 capsule 3   levocetirizine (XYZAL) 5 MG tablet Take 5 mg by mouth every evening.      montelukast (SINGULAIR) 10 MG tablet TAKE 1 TABLET (10 MG TOTAL) BY MOUTH AT BEDTIME AS NEEDED (FOR ALLERGIES). 90 tablet 2   Multiple Vitamins-Minerals (MULTIVITAMIN PO) Take 1 tablet by mouth daily.     rosuvastatin (CRESTOR) 5 MG tablet Take 1 tablet (5 mg total) by mouth daily. For cholesterol. 90 tablet 3   No current facility-administered medications for this visit.    Medication Side Effects: None  Allergies: No Known Allergies  Past Medical History:  Diagnosis Date   Allergy    External hemorrhoids    GAD (generalized anxiety disorder)    Hypertriglyceridemia    MDD (major depressive disorder)    PONV (postoperative nausea and vomiting)    Seasonal allergies    Uterine fibroid     Family History  Problem Relation Age of  Onset   Diabetes Mother    Kidney disease Mother    Cancer Mother 8   Muscular dystrophy Father    Heart disease Father        s/p CABG   Breast cancer Neg Hx    Colon cancer Neg Hx    Colon polyps Neg Hx    Esophageal cancer Neg Hx    Rectal cancer Neg Hx    Stomach cancer Neg Hx     Social History   Socioeconomic History   Marital status: Married    Spouse name: Not on file   Number of children: Not on file    Years of education: Not on file   Highest education level: Not on file  Occupational History   Not on file  Tobacco Use   Smoking status: Never   Smokeless tobacco: Never  Vaping Use   Vaping Use: Never used  Substance and Sexual Activity   Alcohol use: Yes    Alcohol/week: 5.0 standard drinks    Types: 5 Standard drinks or equivalent per week   Drug use: Not Currently   Sexual activity: Yes    Birth control/protection: Surgical  Other Topics Concern   Not on file  Social History Narrative   No children.   Married.   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   Enjoys gardening, baking/cooking, Firefighter.   Social Determinants of Health   Financial Resource Strain: Not on file  Food Insecurity: Not on file  Transportation Needs: Not on file  Physical Activity: Not on file  Stress: Not on file  Social Connections: Not on file  Intimate Partner Violence: Not on file    Past Medical History, Surgical history, Social history, and Family history were reviewed and updated as appropriate.   Please see review of systems for further details on the patient's review from today.   Objective:   Physical Exam:  There were no vitals taken for this visit.  Physical Exam Constitutional:      General: She is not in acute distress. Musculoskeletal:        General: No deformity.  Neurological:     Mental Status: She is alert and oriented to person, place, and time.     Coordination: Coordination normal.  Psychiatric:        Attention and Perception: Attention and perception normal. She does not perceive auditory or visual hallucinations.        Mood and Affect: Mood normal. Mood is not anxious or depressed. Affect is not labile, blunt, angry or inappropriate.        Speech: Speech normal.        Behavior: Behavior normal.        Thought Content: Thought content normal. Thought content is not paranoid or delusional. Thought content does not include homicidal or  suicidal ideation. Thought content does not include homicidal or suicidal plan.        Cognition and Memory: Cognition and memory normal.        Judgment: Judgment normal.     Comments: Insight intact    Lab Review:     Component Value Date/Time   NA 137 04/30/2021 0948   K 4.5 04/30/2021 0948   CL 103 04/30/2021 0948   CO2 26 04/30/2021 0948   GLUCOSE 94 04/30/2021 0948   BUN 12 04/30/2021 0948   CREATININE 0.75 04/30/2021 0948   CALCIUM 9.2 04/30/2021 0948   PROT 7.0 04/30/2021 0948  ALBUMIN 4.2 04/30/2021 0948   AST 17 04/30/2021 0948   ALT 18 04/30/2021 0948   ALKPHOS 27 (L) 04/30/2021 0948   BILITOT 0.5 04/30/2021 0948   GFRNONAA >60 03/04/2020 1027   GFRAA >60 03/04/2020 1027       Component Value Date/Time   WBC 7.6 04/30/2021 0948   RBC 4.24 04/30/2021 0948   HGB 13.9 04/30/2021 0948   HCT 39.7 04/30/2021 0948   PLT 203.0 04/30/2021 0948   MCV 93.5 04/30/2021 0948   MCH 31.8 03/04/2020 1027   MCHC 35.0 04/30/2021 0948   RDW 13.5 04/30/2021 0948   LYMPHSABS 2.2 03/04/2020 1027   MONOABS 0.6 03/04/2020 1027   EOSABS 0.2 03/04/2020 1027   BASOSABS 0.0 03/04/2020 1027    No results found for: POCLITH, LITHIUM   No results found for: PHENYTOIN, PHENOBARB, VALPROATE, CBMZ   .res Assessment: Plan:    Plan:  1. Continue Prozac 20mg  daily  RTC 1 year  Patient advised to contact office with any questions, adverse effects, or acute worsening in signs and symptoms.  Diagnoses and all orders for this visit:  Major depressive disorder, recurrent episode, moderate (HCC) -     FLUoxetine (PROZAC) 20 MG capsule; Take 1 capsule (20 mg total) by mouth daily.  Generalized anxiety disorder -     FLUoxetine (PROZAC) 20 MG capsule; Take 1 capsule (20 mg total) by mouth daily.   Please see After Visit Summary for patient specific instructions.  No future appointments.  No orders of the defined types were placed in this encounter.      -------------------------------

## 2022-05-22 ENCOUNTER — Other Ambulatory Visit: Payer: Self-pay | Admitting: Primary Care

## 2022-05-22 DIAGNOSIS — E781 Pure hyperglyceridemia: Secondary | ICD-10-CM

## 2022-05-27 ENCOUNTER — Other Ambulatory Visit: Payer: Self-pay | Admitting: Primary Care

## 2022-05-27 DIAGNOSIS — J302 Other seasonal allergic rhinitis: Secondary | ICD-10-CM

## 2022-05-27 DIAGNOSIS — R053 Chronic cough: Secondary | ICD-10-CM

## 2022-05-29 ENCOUNTER — Other Ambulatory Visit: Payer: Self-pay | Admitting: Primary Care

## 2022-05-29 DIAGNOSIS — Z1231 Encounter for screening mammogram for malignant neoplasm of breast: Secondary | ICD-10-CM

## 2022-06-05 ENCOUNTER — Other Ambulatory Visit: Payer: Self-pay | Admitting: Primary Care

## 2022-06-05 DIAGNOSIS — Z1231 Encounter for screening mammogram for malignant neoplasm of breast: Secondary | ICD-10-CM

## 2022-06-08 DIAGNOSIS — Z1231 Encounter for screening mammogram for malignant neoplasm of breast: Secondary | ICD-10-CM

## 2022-06-12 ENCOUNTER — Ambulatory Visit
Admission: RE | Admit: 2022-06-12 | Discharge: 2022-06-12 | Disposition: A | Discharge status: Home / Self Care | Payer: BC Managed Care – PPO | Source: Ambulatory Visit

## 2022-06-12 DIAGNOSIS — Z1231 Encounter for screening mammogram for malignant neoplasm of breast: Secondary | ICD-10-CM | POA: Diagnosis not present

## 2022-06-13 ENCOUNTER — Other Ambulatory Visit: Payer: Self-pay | Admitting: Primary Care

## 2022-06-13 DIAGNOSIS — E781 Pure hyperglyceridemia: Secondary | ICD-10-CM

## 2022-06-19 ENCOUNTER — Encounter: Payer: Self-pay | Admitting: Primary Care

## 2022-06-19 ENCOUNTER — Ambulatory Visit: Payer: BC Managed Care – PPO | Admitting: Primary Care

## 2022-06-19 VITALS — BP 118/64 | HR 85 | Temp 98.5°F | Ht 66.0 in | Wt 233.0 lb

## 2022-06-19 DIAGNOSIS — J302 Other seasonal allergic rhinitis: Secondary | ICD-10-CM

## 2022-06-19 DIAGNOSIS — E781 Pure hyperglyceridemia: Secondary | ICD-10-CM

## 2022-06-19 DIAGNOSIS — F411 Generalized anxiety disorder: Secondary | ICD-10-CM

## 2022-06-19 LAB — COMPREHENSIVE METABOLIC PANEL
ALT: 16 U/L (ref 0–35)
AST: 21 U/L (ref 0–37)
Albumin: 4.4 g/dL (ref 3.5–5.2)
Alkaline Phosphatase: 27 U/L — ABNORMAL LOW (ref 39–117)
BUN: 11 mg/dL (ref 6–23)
CO2: 27 mEq/L (ref 19–32)
Calcium: 9.4 mg/dL (ref 8.4–10.5)
Chloride: 101 mEq/L (ref 96–112)
Creatinine, Ser: 0.8 mg/dL (ref 0.40–1.20)
GFR: 88 mL/min (ref >60.00)
Glucose, Bld: 84 mg/dL (ref 70–99)
Potassium: 4.2 mEq/L (ref 3.5–5.1)
Sodium: 137 mEq/L (ref 135–145)
Total Bilirubin: 0.6 mg/dL (ref 0.2–1.2)
Total Protein: 7 g/dL (ref 6.0–8.3)

## 2022-06-19 LAB — LIPID PANEL
Cholesterol: 142 mg/dL (ref 0–200)
HDL: 35.9 mg/dL — ABNORMAL LOW (ref >39.00)
NonHDL: 106.09
Total CHOL/HDL Ratio: 4
Triglycerides: 203 mg/dL — ABNORMAL HIGH (ref 0.0–149.0)
VLDL: 40.6 mg/dL — ABNORMAL HIGH (ref 0.0–40.0)

## 2022-06-19 LAB — LDL CHOLESTEROL, DIRECT: Direct LDL: 72 mg/dL

## 2022-06-19 NOTE — Assessment & Plan Note (Signed)
Repeat lipid panel pending.  Continue rosuvastatin 5 mg daily, fish oil 1000 mg twice daily.  Discussed the importance of a healthy diet and regular exercise in order for weight loss, and to reduce the risk of further co-morbidity.

## 2022-06-19 NOTE — Assessment & Plan Note (Signed)
Controlled, continue fluoxetine 20 mg daily.

## 2022-06-19 NOTE — Assessment & Plan Note (Signed)
Waxes and wanes.  Continue Singulair 10 mg daily, Xyzal 5 mg daily, Flonase twice daily as needed.

## 2022-06-19 NOTE — Patient Instructions (Signed)
Stop by the lab prior to leaving today. I will notify you of your results once received.   It was a pleasure to see you today!  

## 2022-06-19 NOTE — Progress Notes (Signed)
Subjective:    Patient ID: Alisha Wagner, female    DOB: 1975/12/19, 46 y.o.   MRN: [id number]  Medication Refill Pertinent negatives include no chest pain or headaches.    Alisha Wagner is a very pleasant 46 y.o. female who presents today for follow-up of chronic conditions and medication refills.    1) Hypertriglyceridemia: Currently managed on rosuvastatin 5 mg daily, fish oil 1000 mg BID. She is due for repeat lipid panel today.   2) GAD: Currently managed on fluoxetine 20 mg daily. Overall doing well on fluoxetine. She denies SI/HI.   3) Seasonal Allergies: Currently managed on montelukast 10 mg daily, refill cetirizine 5 mg daily. She has recently added Flonase as needed. This season has been tougher with increased nasal congestion and post nasal drip.     Review of Systems  Respiratory:  Negative for shortness of breath.   Cardiovascular:  Negative for chest pain.  Gastrointestinal:  Negative for constipation and diarrhea.  Allergic/Immunologic: Positive for environmental allergies.  Neurological:  Negative for dizziness and headaches.         Past Medical History:  Diagnosis Date   Allergy    External hemorrhoids    GAD (generalized anxiety disorder)    Hypertriglyceridemia    MDD (major depressive disorder)    PONV (postoperative nausea and vomiting)    Seasonal allergies    Uterine fibroid     Social History   Socioeconomic History   Marital status: Married    Spouse name: Not on file   Number of children: Not on file   Years of education: Not on file   Highest education level: Not on file  Occupational History   Not on file  Tobacco Use   Smoking status: Never   Smokeless tobacco: Never  Vaping Use   Vaping Use: Never used  Substance and Sexual Activity   Alcohol use: Yes    Alcohol/week: 5.0 standard drinks of alcohol    Types: 5 Standard drinks or equivalent per week   Drug use: Not Currently   Sexual activity: Yes    Birth  control/protection: Surgical  Other Topics Concern   Not on file  Social History Narrative   No children.   Married.   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   Enjoys gardening, baking/cooking, Firefighter.   Social Determinants of Health   Financial Resource Strain: Not on file  Food Insecurity: Not on file  Transportation Needs: Not on file  Physical Activity: Not on file  Stress: Not on file  Social Connections: Not on file  Intimate Partner Violence: Not on file    Past Surgical History:  Procedure Laterality Date   EVALUATION UNDER ANESTHESIA WITH HEMORRHOIDECTOMY N/A 03/06/2020   Procedure: [place];  Surgeon: Ileana Roup, MD;  Location: [facility];  Service: General;  Laterality: N/A;   HAND SURGERY Right 1996   complex laceration repair   MYOMECTOMY N/A 05/02/2015   Procedure: EXPLORATORY LAPAROTOMY,  MYOMECTOMY;  Surgeon: Servando Salina, MD;  Location: [facility];  Service: Gynecology;  Laterality: N/A;   REFRACTIVE SURGERY  12/2014   bilateral lasik   WISDOM TOOTH EXTRACTION     WISDOM TOOTH EXTRACTION  2010    Family History  Problem Relation Age of Onset   Diabetes Mother    Kidney disease Mother    Cancer Mother 17   Muscular dystrophy Father  Heart disease Father        s/p CABG   Breast cancer Neg Hx    Colon cancer Neg Hx    Colon polyps Neg Hx    Esophageal cancer Neg Hx    Rectal cancer Neg Hx    Stomach cancer Neg Hx     No Known Allergies  Current Outpatient Medications on File Prior to Visit  Medication Sig Dispense Refill   B Complex-Biotin-FA (B-COMPLEX PO) Take 1 tablet by mouth daily.      cholecalciferol (VITAMIN D3) 25 MCG (1000 UNIT) tablet Take 2,000 Units by mouth daily.     fish oil-omega-3 fatty acids 1000 MG capsule Take 1 g by mouth 2 (two) times daily.     FLUoxetine (PROZAC) 20 MG capsule Take 1 capsule (20 mg total) by mouth  daily. 90 capsule 3   levocetirizine (XYZAL) 5 MG tablet Take 5 mg by mouth every evening.      montelukast (SINGULAIR) 10 MG tablet Take 1 tablet (10 mg total) by mouth at bedtime as needed (for allergies). Office visit required for further refills. 90 tablet 0   Multiple Vitamins-Minerals (MULTIVITAMIN PO) Take 1 tablet by mouth daily.     rosuvastatin (CRESTOR) 5 MG tablet TAKE 1 TABLET BY MOUTH EVERY DAY FOR CHOLESTEROL OFFICE VISIT NEEDED 30 tablet 0   No current facility-administered medications on file prior to visit.    BP 118/64   Pulse 85   Temp 98.5 F (36.9 C) (Oral)   Ht '5\' 6"'$  (1.676 m)   Wt 233 lb (105.7 kg)   SpO2 98%   BMI 37.61 kg/m  Objective:   Physical Exam Cardiovascular:     Rate and Rhythm: Normal rate and regular rhythm.  Pulmonary:     Effort: Pulmonary effort is normal.     Breath sounds: Normal breath sounds.  Musculoskeletal:     Cervical back: Neck supple.  Skin:    General: Skin is warm and dry.  Psychiatric:        Mood and Affect: Mood normal.           Assessment & Plan:   Problem List Items Addressed This Visit       Other   Hypertriglyceridemia - Primary    Repeat lipid panel pending.  Continue rosuvastatin 5 mg daily, fish oil 1000 mg twice daily.  Discussed the importance of a healthy diet and regular exercise in order for weight loss, and to reduce the risk of further co-morbidity.        Relevant Orders   Lipid panel   Comprehensive metabolic panel   GAD (generalized anxiety disorder)    Controlled, continue fluoxetine 20 mg daily.      Seasonal allergies    Waxes and wanes.  Continue Singulair 10 mg daily, Xyzal 5 mg daily, Flonase twice daily as needed.          Pleas Koch, NP

## 2022-07-15 ENCOUNTER — Other Ambulatory Visit: Payer: Self-pay | Admitting: Primary Care

## 2022-07-15 DIAGNOSIS — E781 Pure hyperglyceridemia: Secondary | ICD-10-CM

## 2022-08-23 ENCOUNTER — Other Ambulatory Visit: Payer: Self-pay | Admitting: Primary Care

## 2022-08-23 DIAGNOSIS — R053 Chronic cough: Secondary | ICD-10-CM

## 2022-08-23 DIAGNOSIS — J302 Other seasonal allergic rhinitis: Secondary | ICD-10-CM

## 2022-11-18 ENCOUNTER — Ambulatory Visit (INDEPENDENT_AMBULATORY_CARE_PROVIDER_SITE_OTHER): Payer: BC Managed Care – PPO | Admitting: Adult Health

## 2022-11-18 ENCOUNTER — Encounter: Payer: Self-pay | Admitting: Adult Health

## 2022-11-18 DIAGNOSIS — F331 Major depressive disorder, recurrent, moderate: Secondary | ICD-10-CM | POA: Diagnosis not present

## 2022-11-18 DIAGNOSIS — F411 Generalized anxiety disorder: Secondary | ICD-10-CM

## 2022-11-18 MED ORDER — FLUOXETINE HCL 20 MG PO CAPS: 20.0000 mg | ORAL_CAPSULE | Freq: Every day | ORAL | 3 refills | Status: DC

## 2022-11-18 MED ORDER — ALPRAZOLAM 0.25 MG PO TABS: 0.2500 mg | ORAL_TABLET | Freq: Every day | ORAL | 0 refills | Status: DC | PRN

## 2022-11-18 NOTE — Progress Notes (Signed)
[name] [id number] [birth year] 46 y.o.  Subjective:   Patient ID:  Alisha Wagner is a 46 y.o. (DOB Nov 25, 1976) female.  Chief Complaint: No chief complaint on file.   HPI Alisha Wagner presents to the office today for follow-up of anxiety and depression.  Describes mood today as "ok". Pleasant. Tearful at times. Mood symptoms - denies depression and irritability. Feels anxious - "some of the time". Mood is consistent. Reports a recent panic attacks - unable to identify a precipitant - "feeling fragile". Stating "I'm doing alright". Feels like Prozac '20mg'$  continues to work well.  Stable interest and motivation. Taking medications as prescribed.  Energy levels stable. Active, has a regular exercise routine.  Enjoys some usual interests and activities. Married. Lives with wife and 2 cats. Spending time with family and friends. Appetite adequate. Weight stable. Sleeps well most nights. Averages 8 to 9 hours. Focus and concentration difficulties. Completing tasks. Managing aspects of household. Works full-time [employer].  Denies SI or HI.  Denies AH or VH.    [place] Office Visit from 06/19/2022 in [place] at [facility] Visit from 04/30/2021 in [place] at [facility] Visit from 02/12/2015 in [place]  PHQ-2 Total Score 0 0 0  PHQ-9 Total Score 0 0 --        Review of Systems:  Review of Systems  Musculoskeletal:  Negative for gait problem.  Neurological:  Negative for tremors.  Psychiatric/Behavioral:         Please refer to HPI    Medications: I have reviewed the patient's current medications.  Current Outpatient Medications  Medication Sig Dispense Refill   ALPRAZolam (XANAX) 0.25 MG tablet Take 1 tablet (0.25 mg total) by mouth daily as needed for anxiety. 30 tablet 0   B Complex-Biotin-FA (B-COMPLEX PO) Take 1 tablet by mouth daily.      cholecalciferol (VITAMIN D3) 25 MCG (1000 UNIT)  tablet Take 2,000 Units by mouth daily.     fish oil-omega-3 fatty acids 1000 MG capsule Take 1 g by mouth 2 (two) times daily.     FLUoxetine (PROZAC) 20 MG capsule Take 1 capsule (20 mg total) by mouth daily. 90 capsule 3   levocetirizine (XYZAL) 5 MG tablet Take 5 mg by mouth every evening.      montelukast (SINGULAIR) 10 MG tablet Take 1 tablet (10 mg total) by mouth at bedtime. For allergies 90 tablet 2   Multiple Vitamins-Minerals (MULTIVITAMIN PO) Take 1 tablet by mouth daily.     rosuvastatin (CRESTOR) 5 MG tablet Take 1 tablet (5 mg total) by mouth daily. for cholesterol. 90 tablet 3   No current facility-administered medications for this visit.    Medication Side Effects: None  Allergies: No Known Allergies  Past Medical History:  Diagnosis Date   Allergy    External hemorrhoids    GAD (generalized anxiety disorder)    Hypertriglyceridemia    MDD (major depressive disorder)    PONV (postoperative nausea and vomiting)    Seasonal allergies    Uterine fibroid     Past Medical History, Surgical history, Social history, and Family history were reviewed and updated as appropriate.   Please see review of systems for further details on the patient's review from today.   Objective:   Physical Exam:  There were no vitals taken for this visit.  Physical Exam Constitutional:      General: She is not in acute  distress. Musculoskeletal:        General: No deformity.  Neurological:     Mental Status: She is alert and oriented to person, place, and time.     Coordination: Coordination normal.  Psychiatric:        Attention and Perception: Attention and perception normal. She does not perceive auditory or visual hallucinations.        Mood and Affect: Mood normal. Mood is not anxious or depressed. Affect is not labile, blunt, angry or inappropriate.        Speech: Speech normal.        Behavior: Behavior normal.        Thought Content: Thought content normal. Thought  content is not paranoid or delusional. Thought content does not include homicidal or suicidal ideation. Thought content does not include homicidal or suicidal plan.        Cognition and Memory: Cognition and memory normal.        Judgment: Judgment normal.     Comments: Insight intact     Lab Review:     Component Value Date/Time   NA 137 06/19/2022 1209   K 4.2 06/19/2022 1209   CL 101 06/19/2022 1209   CO2 27 06/19/2022 1209   GLUCOSE 84 06/19/2022 1209   BUN 11 06/19/2022 1209   CREATININE 0.80 06/19/2022 1209   CALCIUM 9.4 06/19/2022 1209   PROT 7.0 06/19/2022 1209   ALBUMIN 4.4 06/19/2022 1209   AST 21 06/19/2022 1209   ALT 16 06/19/2022 1209   ALKPHOS 27 (L) 06/19/2022 1209   BILITOT 0.6 06/19/2022 1209   GFRNONAA >60 03/04/2020 1027   GFRAA >60 03/04/2020 1027       Component Value Date/Time   WBC 7.6 04/30/2021 0948   RBC 4.24 04/30/2021 0948   HGB 13.9 04/30/2021 0948   HCT 39.7 04/30/2021 0948   PLT 203.0 04/30/2021 0948   MCV 93.5 04/30/2021 0948   MCH 31.8 03/04/2020 1027   MCHC 35.0 04/30/2021 0948   RDW 13.5 04/30/2021 0948   LYMPHSABS 2.2 03/04/2020 1027   MONOABS 0.6 03/04/2020 1027   EOSABS 0.2 03/04/2020 1027   BASOSABS 0.0 03/04/2020 1027    No results found for: "POCLITH", "LITHIUM"   No results found for: "PHENYTOIN", "PHENOBARB", "VALPROATE", "CBMZ"   .res Assessment: Plan:    Plan:  1. Continue Prozac '20mg'$  daily 2. Xanax 0.'25mg'$  daily as needed for panic attacks.  RTC 1 year  Patient advised to contact office with any questions, adverse effects, or acute worsening in signs and symptoms.  Discussed potential benefits, risk, and side effects of benzodiazepines to include potential risk of tolerance and dependence, as well as possible drowsiness.  Advised patient not to drive if experiencing drowsiness and to take lowest possible effective dose to minimize risk of dependence and tolerance.   Diagnoses and all orders for this  visit:  Major depressive disorder, recurrent episode, moderate (HCC) -     FLUoxetine (PROZAC) 20 MG capsule; Take 1 capsule (20 mg total) by mouth daily.  Generalized anxiety disorder -     FLUoxetine (PROZAC) 20 MG capsule; Take 1 capsule (20 mg total) by mouth daily. -     ALPRAZolam (XANAX) 0.25 MG tablet; Take 1 tablet (0.25 mg total) by mouth daily as needed for anxiety.     Please see After Visit Summary for patient specific instructions.  No future appointments.  No orders of the defined types were placed in this encounter.   -------------------------------

## 2023-03-20 ENCOUNTER — Other Ambulatory Visit: Payer: Self-pay | Admitting: Primary Care

## 2023-03-20 DIAGNOSIS — R053 Chronic cough: Secondary | ICD-10-CM

## 2023-03-20 DIAGNOSIS — J302 Other seasonal allergic rhinitis: Secondary | ICD-10-CM

## 2023-03-21 NOTE — Telephone Encounter (Signed)
Patient is due for follow up in mid July, this will be required prior to any further refills.  Please schedule, thank you!

## 2023-03-22 NOTE — Telephone Encounter (Signed)
Lvmtcb, sent mychart message  

## 2023-04-28 ENCOUNTER — Encounter: Payer: Self-pay | Admitting: Primary Care

## 2023-04-28 ENCOUNTER — Ambulatory Visit: Payer: BC Managed Care – PPO | Admitting: Primary Care

## 2023-04-28 VITALS — BP 144/90 | HR 91 | Temp 97.3°F | Ht 66.0 in | Wt 234.0 lb

## 2023-04-28 DIAGNOSIS — R051 Acute cough: Secondary | ICD-10-CM | POA: Insufficient documentation

## 2023-04-28 MED ORDER — AZITHROMYCIN 250 MG PO TABS: ORAL_TABLET | ORAL | 0 refills | Status: DC

## 2023-04-28 NOTE — Patient Instructions (Signed)
Start Azithromycin antibiotics for infection. Take 2 tablets by mouth today, then 1 tablet daily for 4 additional days.  Resume Flonase, use 1 spray in each nostril twice daily.  Continue Singulair and Xyzal.  Update me if no improvement.  It was a pleasure to see you today!

## 2023-04-28 NOTE — Assessment & Plan Note (Signed)
Given duration of symptoms, coupled with lack of improvement and exam today, will treat.  Start Azithromycin antibiotics for infection. Take 2 tablets by mouth today, then 1 tablet daily for 4 additional days.  Continue Singulair 10 mg daily, Xyzal 5 mg daily, and Flonase BID.  Consider changing allergy regimen if no improvement. She will update.

## 2023-04-28 NOTE — Progress Notes (Signed)
Subjective:    Patient ID: Alisha Wagner, female    DOB: [birth year], 47 y.o.   MRN: [id number]  HPI  Alisha Wagner is a very pleasant 47 y.o. female with a history of seasonal allergies and chronic cough who presents today to discuss nasal congestion.   Symptom onset 6 weeks ago with nasal congestion. She continued to notice symptoms and then developed ear fullness, cough, chest congestion, increased migraines, sinus pressure. Her cough is productive with green/yellow sputum. She is also fatigued.   She denies fevers, body aches.  She's been using Flonase, Singulair 10 mg and Xyzal 5 mg without resolve.    Review of Systems  Constitutional:  Positive for fatigue. Negative for chills and fever.  HENT:  Positive for congestion, postnasal drip and sinus pressure. Negative for ear pain.   Respiratory:  Positive for cough. Negative for shortness of breath.   Allergic/Immunologic: Positive for environmental allergies.  Neurological:  Positive for dizziness.         Past Medical History:  Diagnosis Date   Allergy    External hemorrhoids    GAD (generalized anxiety disorder)    Hypertriglyceridemia    MDD (major depressive disorder)    PONV (postoperative nausea and vomiting)    Seasonal allergies    Uterine fibroid     Social History   Socioeconomic History   Marital status: Married    Spouse name: Not on file   Number of children: Not on file   Years of education: Not on file   Highest education level: Bachelor's degree (e.g., BA, AB, BS)  Occupational History   Not on file  Tobacco Use   Smoking status: Never   Smokeless tobacco: Never  Vaping Use   Vaping Use: Never used  Substance and Sexual Activity   Alcohol use: Yes    Alcohol/week: 5.0 standard drinks of alcohol    Types: 5 Standard drinks or equivalent per week   Drug use: Not Currently   Sexual activity: Yes    Birth control/protection: Surgical  Other Topics Concern   Not on file  Social History  Narrative   No children.   Married.   Works as a Dance movement psychotherapist.   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   Enjoys gardening, baking/cooking, Archivist.   Social Determinants of Health   Financial Resource Strain: Low Risk  (04/26/2023)   Overall Financial Resource Strain (CARDIA)    Difficulty of Paying Living Expenses: Not hard at all  Food Insecurity: No Food Insecurity (04/26/2023)   Hunger Vital Sign    Worried About Running Out of Food in the Last Year: Never true    Ran Out of Food in the Last Year: Never true  Transportation Needs: No Transportation Needs (04/26/2023)   PRAPARE - Administrator, Civil Service (Medical): No    Lack of Transportation (Non-Medical): No  Physical Activity: Sufficiently Active (04/26/2023)   Exercise Vital Sign    Days of Exercise per Week: 4 days    Minutes of Exercise per Session: 60 min  Stress: No Stress Concern Present (04/26/2023)   [employer] of Occupational Health - Occupational Stress Questionnaire    Feeling of Stress : Only a little  Social Connections: Moderately Isolated (04/26/2023)   Social Connection and Isolation Panel [NHANES]    Frequency of Communication with Friends and Family: Three times a week    Frequency of Social Gatherings with Friends and Family: Once a week    Attends  Religious Services: Never    Active Member of Clubs or Organizations: No    Attends Engineer, structural: Not on file    Marital Status: Married  Catering manager Violence: Not on file    Past Surgical History:  Procedure Laterality Date   EVALUATION UNDER ANESTHESIA WITH HEMORRHOIDECTOMY N/A 03/06/2020   Procedure: ANORECTAL EXAM UNDER ANESTHESIA WITH EXTERNAL HEMORRHOIDECTOMY;  Surgeon: Andria Meuse, MD;  Location: [facility];  Service: General;  Laterality: N/A;   HAND SURGERY Right 1996   complex laceration repair   MYOMECTOMY N/A 05/02/2015   Procedure: EXPLORATORY LAPAROTOMY,  MYOMECTOMY;  Surgeon:  Maxie Better, MD;  Location: [facility];  Service: Gynecology;  Laterality: N/A;   REFRACTIVE SURGERY  12/2014   bilateral lasik   WISDOM TOOTH EXTRACTION     WISDOM TOOTH EXTRACTION  2010    Family History  Problem Relation Age of Onset   Diabetes Mother    Kidney disease Mother    Cancer Mother 20   Muscular dystrophy Father    Heart disease Father        s/p CABG   Breast cancer Neg Hx    Colon cancer Neg Hx    Colon polyps Neg Hx    Esophageal cancer Neg Hx    Rectal cancer Neg Hx    Stomach cancer Neg Hx     No Known Allergies  Current Outpatient Medications on File Prior to Visit  Medication Sig Dispense Refill   ALPRAZolam (XANAX) 0.25 MG tablet Take 1 tablet (0.25 mg total) by mouth daily as needed for anxiety. 30 tablet 0   B Complex-Biotin-FA (B-COMPLEX PO) Take 1 tablet by mouth daily.      cholecalciferol (VITAMIN D3) 25 MCG (1000 UNIT) tablet Take 2,000 Units by mouth daily.     fish oil-omega-3 fatty acids 1000 MG capsule Take 1 g by mouth 2 (two) times daily.     FLUoxetine (PROZAC) 20 MG capsule Take 1 capsule (20 mg total) by mouth daily. 90 capsule 3   levocetirizine (XYZAL) 5 MG tablet Take 5 mg by mouth every evening.      montelukast (SINGULAIR) 10 MG tablet Take 1 tablet (10 mg total) by mouth at bedtime. For allergies 90 tablet 2   Multiple Vitamins-Minerals (MULTIVITAMIN PO) Take 1 tablet by mouth daily.     rosuvastatin (CRESTOR) 5 MG tablet Take 1 tablet (5 mg total) by mouth daily. for cholesterol. 90 tablet 3   No current facility-administered medications on file prior to visit.    BP (!) 144/90   Pulse 91   Temp (!) 97.3 F (36.3 C) (Temporal)   Ht 5\' 6"  (1.676 m)   Wt 234 lb (106.1 kg)   LMP 04/23/2023 (Exact Date)   SpO2 98%   BMI 37.77 kg/m  Objective:   Physical Exam Constitutional:      Appearance: She is ill-appearing.  HENT:     Right Ear: Tympanic membrane and ear canal normal.     Left Ear: Tympanic membrane and ear canal  normal.     Nose:     Right Sinus: No maxillary sinus tenderness or frontal sinus tenderness.     Left Sinus: No maxillary sinus tenderness or frontal sinus tenderness.     Mouth/Throat:     Pharynx: No posterior oropharyngeal erythema.  Eyes:     Conjunctiva/sclera: Conjunctivae normal.  Cardiovascular:     Rate and Rhythm: Normal rate and regular rhythm.  Pulmonary:  Effort: Pulmonary effort is normal.     Breath sounds: Normal breath sounds. No wheezing or rales.     Comments: Congested cough noted several times during visit Musculoskeletal:     Cervical back: Neck supple.  Lymphadenopathy:     Cervical: No cervical adenopathy.  Skin:    General: Skin is warm and dry.           Assessment & Plan:  Acute cough Assessment & Plan: Given duration of symptoms, coupled with lack of improvement and exam today, will treat.  Start Azithromycin antibiotics for infection. Take 2 tablets by mouth today, then 1 tablet daily for 4 additional days.  Continue Singulair 10 mg daily, Xyzal 5 mg daily, and Flonase BID.  Consider changing allergy regimen if no improvement. She will update.  Orders: -     Azithromycin; Take 2 tablets by mouth today, then 1 tablet daily for 4 additional days.  Dispense: 6 tablet; Refill: 0        Doreene Nest, NP

## 2023-06-02 DIAGNOSIS — Z1231 Encounter for screening mammogram for malignant neoplasm of breast: Secondary | ICD-10-CM

## 2023-06-04 ENCOUNTER — Other Ambulatory Visit: Payer: Self-pay | Admitting: Primary Care

## 2023-06-04 DIAGNOSIS — Z1231 Encounter for screening mammogram for malignant neoplasm of breast: Secondary | ICD-10-CM

## 2023-06-23 ENCOUNTER — Ambulatory Visit
Admission: RE | Admit: 2023-06-23 | Discharge: 2023-06-23 | Disposition: A | Discharge status: Home / Self Care | Payer: BC Managed Care – PPO | Source: Ambulatory Visit

## 2023-06-23 DIAGNOSIS — Z1231 Encounter for screening mammogram for malignant neoplasm of breast: Secondary | ICD-10-CM

## 2023-06-24 ENCOUNTER — Encounter: Payer: Self-pay | Admitting: Primary Care

## 2023-06-24 ENCOUNTER — Ambulatory Visit: Payer: BC Managed Care – PPO | Admitting: Primary Care

## 2023-06-24 VITALS — BP 127/78 | HR 76 | Temp 97.5°F | Ht 66.0 in | Wt 227.0 lb

## 2023-06-24 DIAGNOSIS — F411 Generalized anxiety disorder: Secondary | ICD-10-CM

## 2023-06-24 DIAGNOSIS — J302 Other seasonal allergic rhinitis: Secondary | ICD-10-CM

## 2023-06-24 DIAGNOSIS — E781 Pure hyperglyceridemia: Secondary | ICD-10-CM | POA: Diagnosis not present

## 2023-06-24 DIAGNOSIS — Z0000 Encounter for general adult medical examination without abnormal findings: Secondary | ICD-10-CM

## 2023-06-24 LAB — COMPREHENSIVE METABOLIC PANEL
ALT: 18 U/L (ref 0–35)
AST: 18 U/L (ref 0–37)
Albumin: 4.2 g/dL (ref 3.5–5.2)
Alkaline Phosphatase: 27 U/L — ABNORMAL LOW (ref 39–117)
BUN: 10 mg/dL (ref 6–23)
CO2: 26 mEq/L (ref 19–32)
Calcium: 9.3 mg/dL (ref 8.4–10.5)
Chloride: 104 mEq/L (ref 96–112)
Creatinine, Ser: 0.78 mg/dL (ref 0.40–1.20)
GFR: 90.07 mL/min (ref >60.00)
Glucose, Bld: 101 mg/dL — ABNORMAL HIGH (ref 70–99)
Potassium: 4.1 mEq/L (ref 3.5–5.1)
Sodium: 138 mEq/L (ref 135–145)
Total Bilirubin: 0.5 mg/dL (ref 0.2–1.2)
Total Protein: 6.7 g/dL (ref 6.0–8.3)

## 2023-06-24 LAB — CBC
HCT: 42.6 % (ref 36.0–46.0)
Hemoglobin: 14.3 g/dL (ref 12.0–15.0)
MCHC: 33.5 g/dL (ref 30.0–36.0)
MCV: 93.9 fl (ref 78.0–100.0)
Platelets: 245 10*3/uL (ref 150.0–400.0)
RBC: 4.53 Mil/uL (ref 3.87–5.11)
RDW: 13.6 % (ref 11.5–15.5)
WBC: 7.3 10*3/uL (ref 4.0–10.5)

## 2023-06-24 LAB — LIPID PANEL
Cholesterol: 123 mg/dL (ref 0–200)
HDL: 32.8 mg/dL — ABNORMAL LOW (ref >39.00)
NonHDL: 90.12
Total CHOL/HDL Ratio: 4
Triglycerides: 240 mg/dL — ABNORMAL HIGH (ref 0.0–149.0)
VLDL: 48 mg/dL — ABNORMAL HIGH (ref 0.0–40.0)

## 2023-06-24 LAB — LDL CHOLESTEROL, DIRECT: Direct LDL: 58 mg/dL

## 2023-06-24 LAB — TSH: TSH: 1.25 u[IU]/mL (ref 0.35–5.50)

## 2023-06-24 NOTE — Assessment & Plan Note (Signed)
Repeat lipid panel pending.  Discussed the importance of a healthy diet and regular exercise in order for weight loss, and to reduce the risk of further co-morbidity. Continue rosuvastatin 5 mg daily. 

## 2023-06-24 NOTE — Assessment & Plan Note (Signed)
Immunizations UTD. Pap smear UTD. Mammogram UTD Colonoscopy UTD, due 2026  Discussed the importance of a healthy diet and regular exercise in order for weight loss, and to reduce the risk of further co-morbidity.  Exam stable. Labs pending.  Follow up in 1 year for repeat physical.  

## 2023-06-24 NOTE — Patient Instructions (Signed)
Stop by the lab prior to leaving today. I will notify you of your results once received.   It was a pleasure to see you today!  

## 2023-06-24 NOTE — Assessment & Plan Note (Signed)
Controlled.  Continue fluoxetine 20 mg daily. 

## 2023-06-24 NOTE — Assessment & Plan Note (Signed)
Controlled.  Continue Singulair 10 mg daily.  Continue Xyzal 5 mg daily. Continue Flonase PRN.

## 2023-06-24 NOTE — Progress Notes (Signed)
Subjective:    Patient ID: Leslye Peer, female    DOB: [birth year], 47 y.o.   MRN: [id number]  HPI  HAELI GERLICH is a very pleasant 47 y.o. female who presents today for complete physical and follow up of chronic conditions.  Immunizations: -Tetanus: Completed in 2022  Diet: Fair diet.  Exercise: No regular exercise. Active at work  Eye exam: Completed >1 year ago. Dental exam: Completes semi-annually    Pap Smear: 2022 Mammogram: Completed yesterday  Colonoscopy: Completed in 2022, due 2025  BP Readings from Last 3 Encounters:  06/24/23 127/78  04/28/23 (!) 144/90  06/19/22 118/64      Review of Systems  Constitutional:  Negative for unexpected weight change.  HENT:  Negative for rhinorrhea.   Respiratory:  Negative for cough and shortness of breath.   Cardiovascular:  Negative for chest pain.  Gastrointestinal:  Negative for constipation and diarrhea.  Genitourinary:  Negative for difficulty urinating and menstrual problem.  Musculoskeletal:  Negative for arthralgias and myalgias.  Skin:  Negative for rash.  Allergic/Immunologic: Negative for environmental allergies.  Neurological:  Negative for dizziness, numbness and headaches.  Psychiatric/Behavioral:  The patient is not nervous/anxious.          Past Medical History:  Diagnosis Date   Allergy    Chronic cough 02/20/2019   External hemorrhoids    GAD (generalized anxiety disorder)    Hemorrhoids 10/30/2019   Hypertriglyceridemia    Keratosis pilaris 01/29/2014   MDD (major depressive disorder)    PONV (postoperative nausea and vomiting)    Seasonal allergies    Uterine fibroid     Social History   Socioeconomic History   Marital status: Married    Spouse name: Not on file   Number of children: Not on file   Years of education: Not on file   Highest education level: Bachelor's degree (e.g., BA, AB, BS)  Occupational History   Not on file  Tobacco Use   Smoking status: Never    Smokeless tobacco: Never  Vaping Use   Vaping status: Never Used  Substance and Sexual Activity   Alcohol use: Yes    Alcohol/week: 5.0 standard drinks of alcohol    Types: 5 Standard drinks or equivalent per week   Drug use: Not Currently   Sexual activity: Yes    Birth control/protection: Surgical  Other Topics Concern   Not on file  Social History Narrative   No children.   Married.   Works as a Dance movement psychotherapist.   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   Enjoys gardening, baking/cooking, Archivist.   Social Determinants of Health   Financial Resource Strain: Low Risk  (04/26/2023)   Overall Financial Resource Strain (CARDIA)    Difficulty of Paying Living Expenses: Not hard at all  Food Insecurity: No Food Insecurity (04/26/2023)   Hunger Vital Sign    Worried About Running Out of Food in the Last Year: Never true    Ran Out of Food in the Last Year: Never true  Transportation Needs: No Transportation Needs (04/26/2023)   PRAPARE - Administrator, Civil Service (Medical): No    Lack of Transportation (Non-Medical): No  Physical Activity: Sufficiently Active (04/26/2023)   Exercise Vital Sign    Days of Exercise per Week: 4 days    Minutes of Exercise per Session: 60 min  Stress: No Stress Concern Present (04/26/2023)   [employer] of Occupational Health - Occupational Stress Questionnaire  Feeling of Stress : Only a little  Social Connections: Moderately Isolated (04/26/2023)   Social Connection and Isolation Panel [NHANES]    Frequency of Communication with Friends and Family: Three times a week    Frequency of Social Gatherings with Friends and Family: Once a week    Attends Religious Services: Never    Database administrator or Organizations: No    Attends Engineer, structural: Not on file    Marital Status: Married  Catering manager Violence: Not on file    Past Surgical History:  Procedure Laterality Date   EVALUATION UNDER ANESTHESIA WITH  HEMORRHOIDECTOMY N/A 03/06/2020   Procedure: ANORECTAL EXAM UNDER ANESTHESIA WITH EXTERNAL HEMORRHOIDECTOMY;  Surgeon: Andria Meuse, MD;  Location: [facility];  Service: General;  Laterality: N/A;   HAND SURGERY Right 1996   complex laceration repair   MYOMECTOMY N/A 05/02/2015   Procedure: EXPLORATORY LAPAROTOMY,  MYOMECTOMY;  Surgeon: Maxie Better, MD;  Location: [facility];  Service: Gynecology;  Laterality: N/A;   REFRACTIVE SURGERY  12/2014   bilateral lasik   WISDOM TOOTH EXTRACTION     WISDOM TOOTH EXTRACTION  2010    Family History  Problem Relation Age of Onset   Diabetes Mother    Kidney disease Mother    Cancer Mother 65   Muscular dystrophy Father    Heart disease Father        s/p CABG   Breast cancer Neg Hx    Colon cancer Neg Hx    Colon polyps Neg Hx    Esophageal cancer Neg Hx    Rectal cancer Neg Hx    Stomach cancer Neg Hx     Allergies  Allergen Reactions   Azithromycin Nausea Only    Current Outpatient Medications on File Prior to Visit  Medication Sig Dispense Refill   ALPRAZolam (XANAX) 0.25 MG tablet Take 1 tablet (0.25 mg total) by mouth daily as needed for anxiety. 30 tablet 0   B Complex-Biotin-FA (B-COMPLEX PO) Take 1 tablet by mouth daily.      cholecalciferol (VITAMIN D3) 25 MCG (1000 UNIT) tablet Take 2,000 Units by mouth daily.     fish oil-omega-3 fatty acids 1000 MG capsule Take 1 g by mouth 2 (two) times daily.     FLUoxetine (PROZAC) 20 MG capsule Take 1 capsule (20 mg total) by mouth daily. 90 capsule 3   levocetirizine (XYZAL) 5 MG tablet Take 5 mg by mouth every evening.      montelukast (SINGULAIR) 10 MG tablet Take 1 tablet (10 mg total) by mouth at bedtime. For allergies 90 tablet 2   Multiple Vitamins-Minerals (MULTIVITAMIN PO) Take 1 tablet by mouth daily.     rosuvastatin (CRESTOR) 5 MG tablet Take 1 tablet (5 mg total) by mouth daily. for cholesterol. 90 tablet 3   No current facility-administered  medications on file prior to visit.    BP 127/78   Pulse 76   Temp (!) 97.5 F (36.4 C) (Temporal)   Ht 5\' 6"  (1.676 m)   Wt 227 lb (103 kg)   LMP 06/23/2023   SpO2 97%   BMI 36.64 kg/m  Objective:   Physical Exam HENT:     Right Ear: Tympanic membrane and ear canal normal.     Left Ear: Tympanic membrane and ear canal normal.     Nose: Nose normal.  Eyes:     Conjunctiva/sclera: Conjunctivae normal.     Pupils: Pupils are equal, round, and  reactive to light.  Neck:     Thyroid: No thyromegaly.  Cardiovascular:     Rate and Rhythm: Normal rate and regular rhythm.     Heart sounds: No murmur heard. Pulmonary:     Effort: Pulmonary effort is normal.     Breath sounds: Normal breath sounds. No rales.  Abdominal:     General: Bowel sounds are normal.     Palpations: Abdomen is soft.     Tenderness: There is no abdominal tenderness.  Musculoskeletal:        General: Normal range of motion.     Cervical back: Neck supple.  Lymphadenopathy:     Cervical: No cervical adenopathy.  Skin:    General: Skin is warm and dry.     Findings: No rash.  Neurological:     Mental Status: She is alert and oriented to person, place, and time.     Cranial Nerves: No cranial nerve deficit.     Deep Tendon Reflexes: Reflexes are normal and symmetric.  Psychiatric:        Mood and Affect: Mood normal.           Assessment & Plan:  Routine general medical examination at a health care facility Assessment & Plan: Immunizations UTD. Pap smear UTD. Mammogram UTD Colonoscopy UTD, due 2026  Discussed the importance of a healthy diet and regular exercise in order for weight loss, and to reduce the risk of further co-morbidity.  Exam stable. Labs pending.  Follow up in 1 year for repeat physical.    GAD (generalized anxiety disorder) Assessment & Plan: Controlled.  Continue fluoxetine 20 mg daily.     Seasonal allergies Assessment & Plan: Controlled.  Continue  Singulair 10 mg daily.  Continue Xyzal 5 mg daily. Continue Flonase PRN.   Hypertriglyceridemia Assessment & Plan: Repeat lipid panel pending.  Discussed the importance of a healthy diet and regular exercise in order for weight loss, and to reduce the risk of further co-morbidity. Continue rosuvastatin 5 mg daily  Orders: -     TSH -     CBC -     Lipid panel -     Comprehensive metabolic panel        Doreene Nest, NP

## 2023-07-26 DIAGNOSIS — J302 Other seasonal allergic rhinitis: Secondary | ICD-10-CM

## 2023-07-26 DIAGNOSIS — R053 Chronic cough: Secondary | ICD-10-CM

## 2023-07-27 ENCOUNTER — Other Ambulatory Visit: Payer: Self-pay | Admitting: Primary Care

## 2023-07-27 DIAGNOSIS — E781 Pure hyperglyceridemia: Secondary | ICD-10-CM

## 2023-07-27 MED ORDER — MONTELUKAST SODIUM 10 MG PO TABS
10.0000 mg | ORAL_TABLET | Freq: Every day | ORAL | 2 refills | Status: DC
Start: 2023-07-27 — End: 2024-01-21

## 2023-09-01 ENCOUNTER — Encounter: Payer: Self-pay | Admitting: Podiatry

## 2023-09-01 ENCOUNTER — Ambulatory Visit: Payer: BC Managed Care – PPO | Admitting: Podiatry

## 2023-09-01 ENCOUNTER — Ambulatory Visit (INDEPENDENT_AMBULATORY_CARE_PROVIDER_SITE_OTHER): Payer: BC Managed Care – PPO

## 2023-09-01 DIAGNOSIS — M84374A Stress fracture, right foot, initial encounter for fracture: Secondary | ICD-10-CM | POA: Diagnosis not present

## 2023-09-01 NOTE — Progress Notes (Signed)
Subjective:  Patient ID: Alisha Wagner, female    DOB: 06-Feb-1976,   MRN: [id number]  Chief Complaint  Patient presents with   Foot Pain    RIGHT FOOT PAIN BEEN HAVING PAIN FOR 4 WKS PAIN IS ON LATERAL SIDE MID FOOT TO 5TH TOE, LIKES TO BE ACTIVE AND THAT IS CAUSING HER MORE PAIN,     47 y.o. female presents for concern as above. Denies any current treatments. Relates she does a lot of walking and jogging.  . Denies any other pedal complaints. Denies n/v/f/c.   Past Medical History:  Diagnosis Date   Allergy    Chronic cough 02/20/2019   External hemorrhoids    GAD (generalized anxiety disorder)    Hemorrhoids 10/30/2019   Hypertriglyceridemia    Keratosis pilaris 01/29/2014   MDD (major depressive disorder)    PONV (postoperative nausea and vomiting)    Seasonal allergies    Uterine fibroid     Objective:  Physical Exam: Vascular: DP/PT pulses 2/4 bilateral. CFT <3 seconds. Normal hair growth on digits. No edema.  Skin. No lacerations or abrasions bilateral feet.  Musculoskeletal: MMT 5/5 bilateral lower extremities in DF, PF, Inversion and Eversion. Deceased ROM in DF of ankle joint. Tender along the diaphysis and some around the head of the fifth metatarsal on the right. No pain with ROM of the fifth digit. No pain along the peroneal tendons. Pain with inversion and eversion of the foot.  Neurological: Sensation intact to light touch.   Assessment:   1. Stress reaction of right foot, initial encounter      Plan:  Patient was evaluated and treated and all questions answered. -Xrays reviewed. No acute fractures or osseous erosions. Some periosteal reaction noted around fifth metatarsal  -Discussed treatement options for stress reaction; risks, alternatives, and benefits explained. -Dispensed CAM boot . Patient to wear at all times and instructed on use -Recommend protection, rest, ice, elevation daily until symptoms improve -Antinflammatories as needed -Patient to  return to office in 4 weeks for serial x-rays to assess healing  or sooner if condition worsens.   Louann Sjogren, DPM

## 2023-09-29 ENCOUNTER — Encounter: Payer: Self-pay | Admitting: Podiatry

## 2023-09-29 ENCOUNTER — Ambulatory Visit: Payer: BC Managed Care – PPO | Admitting: Podiatry

## 2023-09-29 ENCOUNTER — Ambulatory Visit (INDEPENDENT_AMBULATORY_CARE_PROVIDER_SITE_OTHER): Payer: BC Managed Care – PPO

## 2023-09-29 DIAGNOSIS — M84374A Stress fracture, right foot, initial encounter for fracture: Secondary | ICD-10-CM | POA: Diagnosis not present

## 2023-09-29 NOTE — Progress Notes (Signed)
  Subjective:  Patient ID: Alisha Wagner, female    DOB: 06/26/1976,   MRN: [id number]  No chief complaint on file.   47 y.o. female presents for follow-up of stress reaction of right foot. Relates doing better. Does have some aching out of the boot occasionally.   . Denies any other pedal complaints. Denies n/v/f/c.   Past Medical History:  Diagnosis Date   Allergy    Chronic cough 02/20/2019   External hemorrhoids    GAD (generalized anxiety disorder)    Hemorrhoids 10/30/2019   Hypertriglyceridemia    Keratosis pilaris 01/29/2014   MDD (major depressive disorder)    PONV (postoperative nausea and vomiting)    Seasonal allergies    Uterine fibroid     Objective:  Physical Exam: Vascular: DP/PT pulses 2/4 bilateral. CFT <3 seconds. Normal hair growth on digits. No edema.  Skin. No lacerations or abrasions bilateral feet.  Musculoskeletal: MMT 5/5 bilateral lower extremities in DF, PF, Inversion and Eversion. Deceased ROM in DF of ankle joint. Minimally tender along the diaphysis and some around the head of the fifth metatarsal on the right. No pain with ROM of the fifth digit. No pain along the peroneal tendons. Pain with inversion and eversion of the foot.  Neurological: Sensation intact to light touch.   Assessment:   1. Stress reaction of right foot, initial encounter      Plan:  Patient was evaluated and treated and all questions answered. -Xrays reviewed. No acute fractures or osseous erosions. Some periosteal reaction noted around fifth metatarsal  -Discussed treatement options for stress reaction; risks, alternatives, and benefits explained. -May begin to slowly transition out of the CAM boot and back into regular acitivty, then may slowly return to walking and then jogging following this.  -Recommend protection, rest, ice, elevation daily until symptoms improve -Antinflammatories as needed -Patient to return to office as needed   Louann Sjogren, DPM

## 2023-11-29 ENCOUNTER — Ambulatory Visit: Payer: BC Managed Care – PPO | Admitting: Adult Health

## 2023-11-29 ENCOUNTER — Encounter: Payer: Self-pay | Admitting: Adult Health

## 2023-11-29 DIAGNOSIS — F331 Major depressive disorder, recurrent, moderate: Secondary | ICD-10-CM | POA: Diagnosis not present

## 2023-11-29 DIAGNOSIS — F411 Generalized anxiety disorder: Secondary | ICD-10-CM

## 2023-11-29 MED ORDER — FLUOXETINE HCL 20 MG PO CAPS
20.0000 mg | ORAL_CAPSULE | Freq: Every day | ORAL | 3 refills | Status: DC
Start: 2023-11-29 — End: 2024-01-17

## 2023-11-29 MED ORDER — ALPRAZOLAM 0.25 MG PO TABS: 0.2500 mg | ORAL_TABLET | Freq: Every day | ORAL | 2 refills | Status: AC | PRN

## 2023-11-29 NOTE — Progress Notes (Signed)
Alisha Wagner [id number] [birth year] 47 y.o.  Subjective:   Patient ID:  Alisha Wagner is a 47 y.o. (DOB 1976/07/05) female.  Chief Complaint: No chief complaint on file.   HPI Alisha Wagner presents to the office today for follow-up of anxiety and depression.  Describes mood today as "ok". Pleasant. Denies tearfulness. Mood symptoms - denies depression, anxiety, and irritability. Denies panic attacks. Denies worry, rumination, and over thinking. Mood is consistent. Stating "I feel like I'm doing alright". Feels like Prozac 20mg  continues to work well.  Stable interest and motivation. Taking medications as prescribed.  Energy levels stable. Active, has a regular exercise routine.  Enjoys some usual interests and activities. Married. Lives with wife and 2 cats. Spending time with family and friends. Appetite adequate. Weight stable. Sleeps well most nights. Averages 8 to 9 hours. Focus and concentration difficulties. Completing tasks. Managing aspects of household. Works full-time [employer].  Denies SI or HI.  Denies AH or VH.   AUDIT    Flowsheet Row Office Visit from 04/28/2023 in [facility]  Alcohol Use Disorder Identification Test Final Score (AUDIT) 3      GAD-7    Flowsheet Row Office Visit from 06/24/2023 in [facility]  Total GAD-7 Score 0      PHQ2-9    Flowsheet Row Office Visit from 06/24/2023 in [facility] Visit from 04/28/2023 in [facility] Visit from 06/19/2022 in [facility] Visit from 04/30/2021 in [facility] Visit from 02/12/2015 in [facility]  PHQ-2 Total Score 0 0 0 0 0  PHQ-9 Total Score 0 1 0 0 --        Review of Systems:  Review of Systems  Musculoskeletal:  Negative for  gait problem.  Neurological:  Negative for tremors.  Psychiatric/Behavioral:         Please refer to HPI    Medications: I have reviewed the patient's current medications.  Current Outpatient Medications  Medication Sig Dispense Refill   ALPRAZolam (XANAX) 0.25 MG tablet Take 1 tablet (0.25 mg total) by mouth daily as needed for anxiety. 30 tablet 0   B Complex-Biotin-FA (B-COMPLEX PO) Take 1 tablet by mouth daily.      cholecalciferol (VITAMIN D3) 25 MCG (1000 UNIT) tablet Take 2,000 Units by mouth daily.     fish oil-omega-3 fatty acids 1000 MG capsule Take 1 g by mouth 2 (two) times daily.     FLUoxetine (PROZAC) 20 MG capsule Take 1 capsule (20 mg total) by mouth daily. 90 capsule 3   levocetirizine (XYZAL) 5 MG tablet Take 5 mg by mouth every evening.      montelukast (SINGULAIR) 10 MG tablet Take 1 tablet (10 mg total) by mouth at bedtime. For allergies 90 tablet 2   Multiple Vitamins-Minerals (MULTIVITAMIN PO) Take 1 tablet by mouth daily.     rosuvastatin (CRESTOR) 5 MG tablet TAKE 1 TABLET (5 MG TOTAL) BY MOUTH DAILY FOR CHOLESTEROL 90 tablet 2   No current facility-administered medications for this visit.    Medication Side Effects: None  Allergies:  Allergies  Allergen Reactions   Azithromycin Nausea Only    Past Medical History:  Diagnosis Date   Allergy    Chronic cough 02/20/2019   External hemorrhoids  GAD (generalized anxiety disorder)    Hemorrhoids 10/30/2019   Hypertriglyceridemia    Keratosis pilaris 01/29/2014   MDD (major depressive disorder)    PONV (postoperative nausea and vomiting)    Seasonal allergies    Uterine fibroid     Past Medical History, Surgical history, Social history, and Family history were reviewed and updated as appropriate.   Please see review of systems for further details on the patient's review from today.   Objective:   Physical Exam:  There were no vitals taken for this visit.  Physical Exam Constitutional:       General: She is not in acute distress. Musculoskeletal:        General: No deformity.  Neurological:     Mental Status: She is alert and oriented to person, place, and time.     Coordination: Coordination normal.  Psychiatric:        Attention and Perception: Attention and perception normal. She does not perceive auditory or visual hallucinations.        Mood and Affect: Affect is not labile, blunt, angry or inappropriate.        Speech: Speech normal.        Behavior: Behavior normal.        Thought Content: Thought content normal. Thought content is not paranoid or delusional. Thought content does not include homicidal or suicidal ideation. Thought content does not include homicidal or suicidal plan.        Cognition and Memory: Cognition and memory normal.        Judgment: Judgment normal.     Comments: Insight intact     Lab Review:     Component Value Date/Time   NA 138 06/24/2023 0851   K 4.1 06/24/2023 0851   CL 104 06/24/2023 0851   CO2 26 06/24/2023 0851   GLUCOSE 101 (H) 06/24/2023 0851   BUN 10 06/24/2023 0851   CREATININE 0.78 06/24/2023 0851   CALCIUM 9.3 06/24/2023 0851   PROT 6.7 06/24/2023 0851   ALBUMIN 4.2 06/24/2023 0851   AST 18 06/24/2023 0851   ALT 18 06/24/2023 0851   ALKPHOS 27 (L) 06/24/2023 0851   BILITOT 0.5 06/24/2023 0851   GFRNONAA >60 03/04/2020 1027   GFRAA >60 03/04/2020 1027       Component Value Date/Time   WBC 7.3 06/24/2023 0851   RBC 4.53 06/24/2023 0851   HGB 14.3 06/24/2023 0851   HCT 42.6 06/24/2023 0851   PLT 245.0 06/24/2023 0851   MCV 93.9 06/24/2023 0851   MCH 31.8 03/04/2020 1027   MCHC 33.5 06/24/2023 0851   RDW 13.6 06/24/2023 0851   LYMPHSABS 2.2 03/04/2020 1027   MONOABS 0.6 03/04/2020 1027   EOSABS 0.2 03/04/2020 1027   BASOSABS 0.0 03/04/2020 1027    No results found for: "POCLITH", "LITHIUM"   No results found for: "PHENYTOIN", "PHENOBARB", "VALPROATE", "CBMZ"   .res Assessment: Plan:    Plan:  1.  Continue Prozac 20mg  daily 2. Xanax 0.25mg  daily as needed for panic attacks.  RTC 1 year  Patient advised to contact office with any questions, adverse effects, or acute worsening in signs and symptoms.  Discussed potential benefits, risk, and side effects of benzodiazepines to include potential risk of tolerance and dependence, as well as possible drowsiness.  Advised patient not to drive if experiencing drowsiness and to take lowest possible effective dose to minimize risk of dependence and tolerance.   There are no diagnoses linked to this encounter.   Please  see After Visit Summary for patient specific instructions.  No future appointments.  No orders of the defined types were placed in this encounter.   -------------------------------

## 2024-01-17 ENCOUNTER — Other Ambulatory Visit: Payer: Self-pay

## 2024-01-17 DIAGNOSIS — F331 Major depressive disorder, recurrent, moderate: Secondary | ICD-10-CM

## 2024-01-17 DIAGNOSIS — F411 Generalized anxiety disorder: Secondary | ICD-10-CM

## 2024-01-17 MED ORDER — FLUOXETINE HCL 20 MG PO CAPS: 20.0000 mg | ORAL_CAPSULE | Freq: Every day | ORAL | 3 refills | Status: AC

## 2024-01-20 DIAGNOSIS — R053 Chronic cough: Secondary | ICD-10-CM

## 2024-01-20 DIAGNOSIS — E781 Pure hyperglyceridemia: Secondary | ICD-10-CM

## 2024-01-20 DIAGNOSIS — J302 Other seasonal allergic rhinitis: Secondary | ICD-10-CM

## 2024-01-21 MED ORDER — ROSUVASTATIN CALCIUM 5 MG PO TABS: 5.0000 mg | ORAL_TABLET | Freq: Every day | ORAL | 0 refills | Status: DC

## 2024-01-21 MED ORDER — MONTELUKAST SODIUM 10 MG PO TABS: 10.0000 mg | ORAL_TABLET | Freq: Every day | ORAL | 0 refills | Status: DC

## 2024-03-28 ENCOUNTER — Other Ambulatory Visit: Payer: Self-pay | Admitting: Primary Care

## 2024-03-28 DIAGNOSIS — J302 Other seasonal allergic rhinitis: Secondary | ICD-10-CM

## 2024-03-28 DIAGNOSIS — R053 Chronic cough: Secondary | ICD-10-CM

## 2024-03-28 NOTE — Telephone Encounter (Signed)
Patient is due for CPE/follow up in late July, this will be required prior to any further refills.  Please schedule, thank you!   

## 2024-03-28 NOTE — Telephone Encounter (Signed)
 lvm for pt to call office to schedule appt.

## 2024-03-29 NOTE — Telephone Encounter (Signed)
 Patient scheduled.

## 2024-03-29 NOTE — Telephone Encounter (Signed)
 LVM for patient to c/b and schedule.

## 2024-03-29 NOTE — Telephone Encounter (Signed)
 lvm for pt to call office to schedule appt.

## 2024-04-13 ENCOUNTER — Other Ambulatory Visit: Payer: Self-pay | Admitting: Primary Care

## 2024-04-13 DIAGNOSIS — R053 Chronic cough: Secondary | ICD-10-CM

## 2024-04-13 DIAGNOSIS — E781 Pure hyperglyceridemia: Secondary | ICD-10-CM

## 2024-04-13 DIAGNOSIS — J302 Other seasonal allergic rhinitis: Secondary | ICD-10-CM

## 2024-04-13 NOTE — Telephone Encounter (Signed)
 Patient called to reschedule appt due to insurance change. States provider said they will cover medications until next appt due to insurance issue. Patient wanted to check to make sure that was correct and that medications for rosuvastatin  (CRESTOR ) 5 MG tablet and montelukast  (SINGULAIR ) 10 MG tablet will be sent in before she runs out on the 15th. Thank you

## 2024-04-14 ENCOUNTER — Ambulatory Visit: Admitting: Primary Care

## 2024-04-14 ENCOUNTER — Other Ambulatory Visit: Payer: Self-pay | Admitting: Primary Care

## 2024-04-14 DIAGNOSIS — R053 Chronic cough: Secondary | ICD-10-CM

## 2024-04-14 DIAGNOSIS — H578A1 Foreign body sensation, right eye: Secondary | ICD-10-CM | POA: Diagnosis not present

## 2024-04-14 DIAGNOSIS — E781 Pure hyperglyceridemia: Secondary | ICD-10-CM

## 2024-04-14 DIAGNOSIS — J302 Other seasonal allergic rhinitis: Secondary | ICD-10-CM

## 2024-04-19 ENCOUNTER — Other Ambulatory Visit: Payer: Self-pay | Admitting: Primary Care

## 2024-04-19 DIAGNOSIS — E781 Pure hyperglyceridemia: Secondary | ICD-10-CM

## 2024-06-04 ENCOUNTER — Other Ambulatory Visit: Payer: Self-pay | Admitting: Primary Care

## 2024-06-04 DIAGNOSIS — R053 Chronic cough: Secondary | ICD-10-CM

## 2024-06-04 DIAGNOSIS — J302 Other seasonal allergic rhinitis: Secondary | ICD-10-CM

## 2024-06-14 ENCOUNTER — Other Ambulatory Visit: Payer: Self-pay | Admitting: Primary Care

## 2024-06-14 DIAGNOSIS — Z1231 Encounter for screening mammogram for malignant neoplasm of breast: Secondary | ICD-10-CM

## 2024-06-26 ENCOUNTER — Ambulatory Visit
Admission: RE | Admit: 2024-06-26 | Discharge: 2024-06-26 | Disposition: A | Discharge status: Home / Self Care | Source: Ambulatory Visit

## 2024-06-26 DIAGNOSIS — Z1231 Encounter for screening mammogram for malignant neoplasm of breast: Secondary | ICD-10-CM | POA: Diagnosis not present

## 2024-06-27 ENCOUNTER — Ambulatory Visit: Payer: Self-pay | Admitting: Primary Care

## 2024-06-27 ENCOUNTER — Encounter: Payer: Self-pay | Admitting: Primary Care

## 2024-06-27 ENCOUNTER — Other Ambulatory Visit (HOSPITAL_COMMUNITY)
Admission: RE | Admit: 2024-06-27 | Discharge: 2024-06-27 | Disposition: A | Discharge status: Home / Self Care | Source: Ambulatory Visit | Attending: Primary Care | Admitting: Primary Care

## 2024-06-27 ENCOUNTER — Ambulatory Visit (INDEPENDENT_AMBULATORY_CARE_PROVIDER_SITE_OTHER): Admitting: Primary Care

## 2024-06-27 VITALS — BP 116/74 | HR 87 | Temp 97.2°F | Ht 66.0 in | Wt 233.0 lb

## 2024-06-27 DIAGNOSIS — Z124 Encounter for screening for malignant neoplasm of cervix: Secondary | ICD-10-CM

## 2024-06-27 DIAGNOSIS — E781 Pure hyperglyceridemia: Secondary | ICD-10-CM

## 2024-06-27 DIAGNOSIS — J302 Other seasonal allergic rhinitis: Secondary | ICD-10-CM | POA: Diagnosis not present

## 2024-06-27 DIAGNOSIS — Z0000 Encounter for general adult medical examination without abnormal findings: Secondary | ICD-10-CM

## 2024-06-27 DIAGNOSIS — Z1211 Encounter for screening for malignant neoplasm of colon: Secondary | ICD-10-CM

## 2024-06-27 DIAGNOSIS — F411 Generalized anxiety disorder: Secondary | ICD-10-CM | POA: Diagnosis not present

## 2024-06-27 LAB — COMPREHENSIVE METABOLIC PANEL WITH GFR
ALT: 16 U/L (ref 0–35)
AST: 16 U/L (ref 0–37)
Albumin: 4.3 g/dL (ref 3.5–5.2)
Alkaline Phosphatase: 29 U/L — ABNORMAL LOW (ref 39–117)
BUN: 10 mg/dL (ref 6–23)
CO2: 28 meq/L (ref 19–32)
Calcium: 9.3 mg/dL (ref 8.4–10.5)
Chloride: 101 meq/L (ref 96–112)
Creatinine, Ser: 0.76 mg/dL (ref 0.40–1.20)
GFR: 92.26 mL/min (ref >60.00)
Glucose, Bld: 95 mg/dL (ref 70–99)
Potassium: 4 meq/L (ref 3.5–5.1)
Sodium: 137 meq/L (ref 135–145)
Total Bilirubin: 0.5 mg/dL (ref 0.2–1.2)
Total Protein: 6.9 g/dL (ref 6.0–8.3)

## 2024-06-27 LAB — CBC
HCT: 42.1 % (ref 36.0–46.0)
Hemoglobin: 14.4 g/dL (ref 12.0–15.0)
MCHC: 34.2 g/dL (ref 30.0–36.0)
MCV: 93.1 fl (ref 78.0–100.0)
Platelets: 243 K/uL (ref 150.0–400.0)
RBC: 4.52 Mil/uL (ref 3.87–5.11)
RDW: 13.6 % (ref 11.5–15.5)
WBC: 7.8 K/uL (ref 4.0–10.5)

## 2024-06-27 LAB — LIPID PANEL
Cholesterol: 142 mg/dL (ref 0–200)
HDL: 38.1 mg/dL — ABNORMAL LOW (ref >39.00)
LDL Cholesterol: 57 mg/dL (ref 0–99)
NonHDL: 103.59
Total CHOL/HDL Ratio: 4
Triglycerides: 234 mg/dL — ABNORMAL HIGH (ref 0.0–149.0)
VLDL: 46.8 mg/dL — ABNORMAL HIGH (ref 0.0–40.0)

## 2024-06-27 LAB — HEMOGLOBIN A1C: Hgb A1c MFr Bld: 5.4 % (ref 4.6–6.5)

## 2024-06-27 NOTE — Progress Notes (Signed)
 Subjective:    Patient ID: Alisha Wagner, female    DOB: 1976/03/08, 48 y.o.   MRN: [id number]  HPI  Alisha Wagner is a very pleasant 48 y.o. female who presents today for complete physical and follow up of chronic conditions.  Immunizations: -Tetanus: Completed in 2022  Diet: Fair diet.  Exercise: Regular exercise.  Eye exam: Completes annually  Dental exam: Completes semi-annually    Pap Smear: Completed in June 2022,  due today. Mammogram: Completed yesterday.  Colonoscopy: Completed in July 2022, due July 2025      Review of Systems  Constitutional:  Negative for unexpected weight change.  HENT:  Negative for rhinorrhea.   Respiratory:  Negative for cough and shortness of breath.   Cardiovascular:  Negative for chest pain.  Gastrointestinal:  Negative for constipation and diarrhea.  Genitourinary:  Negative for difficulty urinating.  Musculoskeletal:  Positive for arthralgias.  Skin:  Negative for rash.  Allergic/Immunologic: Negative for environmental allergies.  Neurological:  Negative for dizziness and headaches.  Psychiatric/Behavioral:  The patient is not nervous/anxious.          Past Medical History:  Diagnosis Date   Allergy    Chronic cough 02/20/2019   External hemorrhoids    GAD (generalized anxiety disorder)    Hemorrhoids 10/30/2019   Hypertriglyceridemia    Keratosis pilaris 01/29/2014   MDD (major depressive disorder)    PONV (postoperative nausea and vomiting)    Seasonal allergies    Uterine fibroid     Social History   Socioeconomic History   Marital status: Married    Spouse name: Not on file   Number of children: Not on file   Years of education: Not on file   Highest education level: Bachelor's degree (e.g., BA, AB, BS)  Occupational History   Not on file  Tobacco Use   Smoking status: Never   Smokeless tobacco: Never  Vaping Use   Vaping status: Never Used  Substance and Sexual Activity   Alcohol use: Yes     Alcohol/week: 5.0 standard drinks of alcohol    Types: 5 Standard drinks or equivalent per week   Drug use: Not Currently   Sexual activity: Yes    Birth control/protection: Surgical  Other Topics Concern   Not on file  Social History Narrative   No children.   Married.   Works as a Dance movement psychotherapist.   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   Enjoys gardening, baking/cooking, Archivist.   Social Drivers of Corporate investment banker Strain: Low Risk  (06/25/2024)   Overall Financial Resource Strain (CARDIA)    Difficulty of Paying Living Expenses: Not hard at all  Food Insecurity: No Food Insecurity (06/25/2024)   Hunger Vital Sign    Worried About Running Out of Food in the Last Year: Never true    Ran Out of Food in the Last Year: Never true  Transportation Needs: No Transportation Needs (06/25/2024)   PRAPARE - Administrator, Civil Service (Medical): No    Lack of Transportation (Non-Medical): No  Physical Activity: Insufficiently Active (06/25/2024)   Exercise Vital Sign    Days of Exercise per Week: 5 days    Minutes of Exercise per Session: 20 min  Stress: No Stress Concern Present (06/25/2024)   [employer] of Occupational Health - Occupational Stress Questionnaire    Feeling of Stress: Only a little  Recent Concern: Stress - Stress Concern Present (04/10/2024)   [employer] of  Occupational Health - Occupational Stress Questionnaire    Feeling of Stress : To some extent  Social Connections: Moderately Isolated (06/25/2024)   Social Connection and Isolation Panel    Frequency of Communication with Friends and Family: Three times a week    Frequency of Social Gatherings with Friends and Family: Once a week    Attends Religious Services: Never    Database administrator or Organizations: No    Attends Engineer, structural: Not on file    Marital Status: Married  Catering manager Violence: Not on file    Past Surgical History:  Procedure Laterality  Date   EVALUATION UNDER ANESTHESIA WITH HEMORRHOIDECTOMY N/A 03/06/2020   Procedure: ANORECTAL EXAM UNDER ANESTHESIA WITH EXTERNAL HEMORRHOIDECTOMY;  Surgeon: Teresa Lonni HERO, MD;  Location: [facility];  Service: General;  Laterality: N/A;   HAND SURGERY Right 1996   complex laceration repair   MYOMECTOMY N/A 05/02/2015   Procedure: EXPLORATORY LAPAROTOMY,  MYOMECTOMY;  Surgeon: Dickie Carder, MD;  Location: [facility];  Service: Gynecology;  Laterality: N/A;   REFRACTIVE SURGERY  12/2014   bilateral lasik   WISDOM TOOTH EXTRACTION     WISDOM TOOTH EXTRACTION  2010    Family History  Problem Relation Age of Onset   Diabetes Mother    Kidney disease Mother    Cancer Mother 14   Muscular dystrophy Father    Heart disease Father        s/p CABG   Breast cancer Neg Hx    Colon cancer Neg Hx    Colon polyps Neg Hx    Esophageal cancer Neg Hx    Rectal cancer Neg Hx    Stomach cancer Neg Hx     Allergies  Allergen Reactions   Azithromycin  Nausea Only    Current Outpatient Medications on File Prior to Visit  Medication Sig Dispense Refill   ALPRAZolam  (XANAX ) 0.25 MG tablet Take 1 tablet (0.25 mg total) by mouth daily as needed for anxiety. 30 tablet 2   B Complex-Biotin-FA (B-COMPLEX PO) Take 1 tablet by mouth daily.      cholecalciferol (VITAMIN D3) 25 MCG (1000 UNIT) tablet Take 2,000 Units by mouth daily.     fish oil-omega-3 fatty acids 1000 MG capsule Take 1 g by mouth 2 (two) times daily.     FLUoxetine  (PROZAC ) 20 MG capsule Take 1 capsule (20 mg total) by mouth daily. 90 capsule 3   levocetirizine (XYZAL) 5 MG tablet Take 5 mg by mouth every evening.      montelukast  (SINGULAIR ) 10 MG tablet TAKE 1 TABLET BY MOUTH AT  BEDTIME FOR ALLERGIES 90 tablet 0   Multiple Vitamins-Minerals (MULTIVITAMIN PO) Take 1 tablet by mouth daily.     rosuvastatin  (CRESTOR ) 5 MG tablet TAKE 1 TABLET BY MOUTH DAILY FOR CHOLESTEROL 90 tablet 0   No current  facility-administered medications on file prior to visit.    BP 116/74   Pulse 87   Temp (!) 97.2 F (36.2 C) (Temporal)   Ht 5' 6 (1.676 m)   Wt 233 lb (105.7 kg)   LMP 06/09/2024   SpO2 98%   BMI 37.61 kg/m  Objective:   Physical Exam Exam conducted with a chaperone present.  HENT:     Right Ear: Tympanic membrane and ear canal normal.     Left Ear: Tympanic membrane and ear canal normal.  Eyes:     Pupils: Pupils are equal, round, and reactive to light.  Cardiovascular:     Rate and Rhythm: Normal rate and regular rhythm.  Pulmonary:     Effort: Pulmonary effort is normal.     Breath sounds: Normal breath sounds.  Abdominal:     General: Bowel sounds are normal.     Palpations: Abdomen is soft.     Tenderness: There is no abdominal tenderness.  Genitourinary:    Labia:        Right: No rash, tenderness or lesion.        Left: No rash, tenderness or lesion.      Vagina: Normal.     Cervix: Normal.     Uterus: Normal.      Adnexa: Right adnexa normal and left adnexa normal.  Musculoskeletal:        General: Normal range of motion.     Cervical back: Neck supple.  Skin:    General: Skin is warm and dry.  Neurological:     Mental Status: She is alert and oriented to person, place, and time.     Cranial Nerves: No cranial nerve deficit.     Deep Tendon Reflexes:     Reflex Scores:      Patellar reflexes are 2+ on the right side and 2+ on the left side. Psychiatric:        Mood and Affect: Mood normal.           Assessment & Plan:  Routine general medical examination at a health care facility Assessment & Plan: Immunizations UTD. Pap smear due, completed today. Mammogram completed yesterday. Colonoscopy due, referral placed to GI  Discussed the importance of a healthy diet and regular exercise in order for weight loss, and to reduce the risk of further co-morbidity.  Exam stable. Labs pending.  Follow up in 1 year for repeat physical.    GAD  (generalized anxiety disorder) Assessment & Plan: Controlled. Following with psychiatric NP.  Continue fluoxetine  20 mg daily.   Seasonal allergies Assessment & Plan: Stable.  Continue Singulair  10 mg HS Continue Xyzal 5 mg daily.    Hypertriglyceridemia Assessment & Plan: Repeat lipid panel pending.  Continue rosuvastatin  5 mg daily.   Orders: -     Lipid panel -     CBC -     Comprehensive metabolic panel with GFR -     Hemoglobin A1c  Screening for cervical cancer -     Cytology - PAP  Screening for colon cancer -     Ambulatory referral to Gastroenterology        Comer MARLA Gaskins, NP

## 2024-06-27 NOTE — Assessment & Plan Note (Signed)
 Stable.  Continue Singulair  10 mg HS Continue Xyzal 5 mg daily.

## 2024-06-27 NOTE — Patient Instructions (Signed)
 Stop by the lab prior to leaving today. I will notify you of your results once received.   You will receive a phone call regarding the colonoscopy.  It was a pleasure to see you today!

## 2024-06-27 NOTE — Assessment & Plan Note (Addendum)
 Controlled. Following with psychiatric NP.  Continue fluoxetine  20 mg daily.

## 2024-06-27 NOTE — Assessment & Plan Note (Signed)
 Immunizations UTD. Pap smear due, completed today. Mammogram completed yesterday. Colonoscopy due, referral placed to GI  Discussed the importance of a healthy diet and regular exercise in order for weight loss, and to reduce the risk of further co-morbidity.  Exam stable. Labs pending.  Follow up in 1 year for repeat physical.

## 2024-06-27 NOTE — Assessment & Plan Note (Signed)
 Repeat lipid panel pending. Continue rosuvastatin 5 mg daily.

## 2024-06-30 ENCOUNTER — Ambulatory Visit: Payer: Self-pay | Admitting: Primary Care

## 2024-06-30 LAB — CYTOLOGY - PAP
Adequacy: ABSENT
Comment: NEGATIVE
Diagnosis: NEGATIVE
High risk HPV: NEGATIVE

## 2024-07-15 ENCOUNTER — Encounter: Payer: Self-pay | Admitting: Gastroenterology

## 2024-07-28 ENCOUNTER — Encounter: Payer: Self-pay | Admitting: Gastroenterology

## 2024-08-11 ENCOUNTER — Other Ambulatory Visit: Payer: Self-pay | Admitting: Primary Care

## 2024-08-11 DIAGNOSIS — J302 Other seasonal allergic rhinitis: Secondary | ICD-10-CM

## 2024-08-11 DIAGNOSIS — R053 Chronic cough: Secondary | ICD-10-CM

## 2024-08-11 DIAGNOSIS — E781 Pure hyperglyceridemia: Secondary | ICD-10-CM

## 2024-08-23 ENCOUNTER — Ambulatory Visit (AMBULATORY_SURGERY_CENTER)

## 2024-08-23 VITALS — Ht 66.0 in | Wt 220.0 lb

## 2024-08-23 DIAGNOSIS — Z860100 Personal history of colon polyps, unspecified: Secondary | ICD-10-CM

## 2024-08-23 MED ORDER — NA SULFATE-K SULFATE-MG SULF 17.5-3.13-1.6 GM/177ML PO SOLN: 1.0000 | Freq: Once | ORAL | 0 refills | Status: AC

## 2024-08-23 NOTE — Progress Notes (Signed)
 No egg or soy allergy known to patient  No issues known to pt with past sedation with any surgeries or procedures Patient denies ever being told they had issues or difficulty with intubation  No FH of Malignant Hyperthermia Pt is not on diet pills Pt is not on  home 02  Pt is not on blood thinners  Pt has issues with constipation and takes it 4 X a week No A fib or A flutter Have any cardiac testing pending--no Pt can ambulate independently Pt denies use of chewing tobacco Discussed diabetic I weight loss medication holds Discussed NSAID holds Checked BMI Pt instructed to use Singlecare.com or GoodRx for a price reduction on prep  Patient's chart reviewed by Norleen Schillings CNRA prior to previsit and patient appropriate for the [facility].  Pre visit completed and red dot placed by patient's name on their procedure day (on provider's schedule).

## 2024-09-06 ENCOUNTER — Encounter: Admitting: Gastroenterology

## 2024-09-14 ENCOUNTER — Encounter: Admitting: Gastroenterology

## 2024-09-17 ENCOUNTER — Encounter: Payer: Self-pay | Admitting: Gastroenterology

## 2024-09-19 ENCOUNTER — Encounter: Payer: Self-pay | Admitting: Gastroenterology

## 2024-09-26 ENCOUNTER — Encounter: Payer: Self-pay | Admitting: Gastroenterology

## 2024-09-26 ENCOUNTER — Ambulatory Visit (AMBULATORY_SURGERY_CENTER): Admitting: Gastroenterology

## 2024-09-26 VITALS — BP 115/67 | HR 60 | Temp 97.7°F | Resp 17 | Ht 66.0 in | Wt 220.0 lb

## 2024-09-26 DIAGNOSIS — K5730 Diverticulosis of large intestine without perforation or abscess without bleeding: Secondary | ICD-10-CM

## 2024-09-26 DIAGNOSIS — Z860100 Personal history of colon polyps, unspecified: Secondary | ICD-10-CM

## 2024-09-26 DIAGNOSIS — Z1211 Encounter for screening for malignant neoplasm of colon: Secondary | ICD-10-CM | POA: Diagnosis not present

## 2024-09-26 DIAGNOSIS — Z860101 Personal history of adenomatous and serrated colon polyps: Secondary | ICD-10-CM | POA: Diagnosis not present

## 2024-09-26 DIAGNOSIS — K648 Other hemorrhoids: Secondary | ICD-10-CM | POA: Diagnosis not present

## 2024-09-26 MED ORDER — SODIUM CHLORIDE 0.9 % IV SOLN: 500.0000 mL | Freq: Once | INTRAVENOUS | Status: DC

## 2024-09-26 NOTE — Patient Instructions (Signed)
 Resume previous diet Continue present medications There were no colon polyps seen today!  You will need another screening colonoscopy in 7 years, you will receive a letter at that time when you are due for the procedure.   Please call us  at [phone] if you have a change in bowel habits, change in family history of colo-rectal cancer, rectal bleeding or other GI concern before that time.  Handouts/information given for diverticulosis and hemorrhoids  YOU HAD AN ENDOSCOPIC PROCEDURE TODAY AT THE [facility]:   Refer to the procedure report that was given to you for any specific questions about what was found during the examination.  If the procedure report does not answer your questions, please call your gastroenterologist to clarify.  If you requested that your care partner not be given the details of your procedure findings, then the procedure report has been included in a sealed envelope for you to review at your convenience later.  YOU SHOULD EXPECT: Some feelings of bloating in the abdomen. Passage of more gas than usual.  Walking can help get rid of the air that was put into your GI tract during the procedure and reduce the bloating. If you had a lower endoscopy (such as a colonoscopy or flexible sigmoidoscopy) you may notice spotting of blood in your stool or on the toilet paper. If you underwent a bowel prep for your procedure, you may not have a normal bowel movement for a few days.  Please Note:  You might notice some irritation and congestion in your nose or some drainage.  This is from the oxygen used during your procedure.  There is no need for concern and it should clear up in a day or so.  SYMPTOMS TO REPORT IMMEDIATELY:  Following lower endoscopy (colonoscopy):  Excessive amounts of blood in the stool  Significant tenderness or worsening of abdominal pains  Swelling of the abdomen that is new, acute  Fever of 100F or higher For urgent or emergent issues, a  gastroenterologist can be reached at any hour by calling [phone]. Do not use MyChart messaging for urgent concerns.   DIET:  We do recommend a small meal at first, but then you may proceed to your regular diet.  Drink plenty of fluids but you should avoid alcoholic beverages for 24 hours.  ACTIVITY:  You should plan to take it easy for the rest of today and you should NOT DRIVE or use heavy machinery until tomorrow (because of the sedation medicines used during the test).    FOLLOW UP: Our staff will call the number listed on your records the next business day following your procedure.  We will call around 7:15- 8:00 am to check on you and address any questions or concerns that you may have regarding the information given to you following your procedure. If we do not reach you, we will leave a message.     SIGNATURES/CONFIDENTIALITY: You and/or your care partner have signed paperwork which will be entered into your electronic medical record.  These signatures attest to the fact that that the information above on your After Visit Summary has been reviewed and is understood.  Full responsibility of the confidentiality of this discharge information lies with you and/or your care-partner.

## 2024-09-26 NOTE — Progress Notes (Signed)
 History and Physical:  This patient presents for endoscopic testing for: Encounter Diagnosis  Name Primary?   History of colonic polyps Yes    Surveillance colonoscopy today for history of sessile serrated polyp in the cecum in July 2022  Patient denies chronic abdominal pain, rectal bleeding, constipation or diarrhea.   Patient is otherwise without complaints or active issues today.   Past Medical History: Past Medical History:  Diagnosis Date   Allergy    Arthritis    Chronic cough 02/20/2019   External hemorrhoids    GAD (generalized anxiety disorder)    Hemorrhoids 10/30/2019   Hypertriglyceridemia    Keratosis pilaris 01/29/2014   MDD (major depressive disorder)    PONV (postoperative nausea and vomiting)    Seasonal allergies    Uterine fibroid      Past Surgical History: Past Surgical History:  Procedure Laterality Date   EVALUATION UNDER ANESTHESIA WITH HEMORRHOIDECTOMY N/A 03/06/2020   Procedure: ANORECTAL EXAM UNDER ANESTHESIA WITH EXTERNAL HEMORRHOIDECTOMY;  Surgeon: Teresa Lonni HERO, MD;  Location: [facility];  Service: General;  Laterality: N/A;   HAND SURGERY Right 1996   complex laceration repair   MYOMECTOMY N/A 05/02/2015   Procedure: EXPLORATORY LAPAROTOMY,  MYOMECTOMY;  Surgeon: Dickie Carder, MD;  Location: [facility];  Service: Gynecology;  Laterality: N/A;   REFRACTIVE SURGERY  12/2014   bilateral lasik   WISDOM TOOTH EXTRACTION     WISDOM TOOTH EXTRACTION  2010    Allergies: Allergies  Allergen Reactions   Azithromycin  Nausea Only    Outpatient Meds: Current Outpatient Medications  Medication Sig Dispense Refill   B Complex-Biotin-FA (B-COMPLEX PO) Take 1 tablet by mouth daily.      cholecalciferol (VITAMIN D3) 25 MCG (1000 UNIT) tablet Take 2,000 Units by mouth daily.     fish oil-omega-3 fatty acids 1000 MG capsule Take 1 g by mouth 2 (two) times daily.     FLUoxetine  (PROZAC ) 20 MG capsule Take 1 capsule (20 mg  total) by mouth daily. 90 capsule 3   levocetirizine (XYZAL) 5 MG tablet Take 5 mg by mouth every evening.      montelukast  (SINGULAIR ) 10 MG tablet TAKE 1 TABLET BY MOUTH AT  BEDTIME FOR ALLERGIES 90 tablet 2   Multiple Vitamins-Minerals (MULTIVITAMIN PO) Take 1 tablet by mouth daily.     rosuvastatin  (CRESTOR ) 5 MG tablet TAKE 1 TABLET BY MOUTH DAILY FOR CHOLESTEROL 90 tablet 2   ALPRAZolam  (XANAX ) 0.25 MG tablet Take 1 tablet (0.25 mg total) by mouth daily as needed for anxiety. 30 tablet 2   Current Facility-Administered Medications  Medication Dose Route Frequency Provider Last Rate Last Admin   0.9 %  sodium chloride  infusion  500 mL Intravenous Once Danis, Victory CROME III, MD          ___________________________________________________________________ Objective   Exam:  BP (!) 137/92   Pulse 74   Temp 97.7 F (36.5 C) (Temporal)   Ht 5' 6 (1.676 m)   Wt 220 lb (99.8 kg)   SpO2 98%   BMI 35.51 kg/m   CV: regular , S1/S2 Resp: clear to auscultation bilaterally, normal RR and effort noted GI: soft, no tenderness, with active bowel sounds.   Assessment: Encounter Diagnosis  Name Primary?   History of colonic polyps Yes     Plan: Colonoscopy   The benefits and risks of the planned procedure(s) were described in detail with the patient or (when appropriate) their health care proxy.  Risks were outlined  as including, but not limited to, bleeding, infection, perforation, adverse medication reaction leading to cardiac or pulmonary decompensation, pancreatitis (if ERCP).  The limitation of incomplete mucosal visualization was also discussed.  No guarantees or warranties were given.  The patient is appropriate for an endoscopic procedure in the ambulatory setting.   - Victory Brand, MD

## 2024-09-26 NOTE — Progress Notes (Signed)
 Transferred to PACU via stretcher, arousing, VSS.

## 2024-09-26 NOTE — Op Note (Signed)
 [facility] Patient Name: Alisha Wagner Procedure Date: 09/26/2024 9:57 AM MRN: [id number] Endoscopist: Victory L. Legrand , MD, [id number] Age: 48 Referring MD:  Date of Birth: 1976/03/20 Gender: Female Account #: [id number] Procedure:                Colonoscopy Indications:              Personal history of sessile serrated colon polyp                            (less than 10 mm in size) with no dysplasia                           8mm SSp near [facility] July 2025 Medicines:                Monitored Anesthesia Care Procedure:                Pre-Anesthesia Assessment:                           - Prior to the procedure, a History and Physical                            was performed, and patient medications and                            allergies were reviewed. The patient's tolerance of                            previous anesthesia was also reviewed. The risks                            and benefits of the procedure and the sedation                            options and risks were discussed with the patient.                            All questions were answered, and informed consent                            was obtained. Prior Anticoagulants: The patient has                            taken no anticoagulant or antiplatelet agents. ASA                            Grade Assessment: II - A patient with mild systemic                            disease. After reviewing the risks and benefits,                            the patient was deemed in satisfactory condition to  undergo the procedure.                           After obtaining informed consent, the colonoscope                            was passed under direct vision. Throughout the                            procedure, the patient's blood pressure, pulse, and                            oxygen saturations were monitored continuously. The                            Olympus CF-HQ190L ([id number])  Colonoscope was                            introduced through the anus and advanced to the the                            cecum, identified by appendiceal orifice and                            ileocecal valve. The colonoscopy was performed                            without difficulty. The patient tolerated the                            procedure well. The quality of the bowel                            preparation was good. The ileocecal valve,                            appendiceal orifice, and rectum were photographed. Scope In: 10:06:17 AM Scope Out: 10:21:30 AM Scope Withdrawal Time: 0 hours 11 minutes 22 seconds  Total Procedure Duration: 0 hours 15 minutes 13 seconds  Findings:                 The perianal and digital rectal examinations were                            normal.                           Repeat examination of right colon under NBI                            performed.                           Multiple diverticula were found in the left colon.  Internal hemorrhoids were found. The hemorrhoids                            were small.                           The exam was otherwise without abnormality on                            direct and retroflexion views. Complications:            No immediate complications. Estimated Blood Loss:     Estimated blood loss: none. Impression:               - Diverticulosis in the left colon.                           - Internal hemorrhoids.                           - The examination was otherwise normal on direct                            and retroflexion views.                           - No specimens collected. Recommendation:           - Patient has a contact number available for                            emergencies. The signs and symptoms of potential                            delayed complications were discussed with the                            patient. Return to normal activities tomorrow.                             Written discharge instructions were provided to the                            patient.                           - Resume previous diet.                           - Continue present medications.                           - Repeat colonoscopy in 7 years for surveillance. Itzy Adler L. Legrand, MD 09/26/2024 10:26:23 AM This report has been signed electronically.

## 2024-09-26 NOTE — Progress Notes (Signed)
 Pt's states no medical or surgical changes since previsit or office visit.

## 2024-09-27 ENCOUNTER — Telehealth: Payer: Self-pay

## 2024-09-27 NOTE — Telephone Encounter (Signed)
 Left message on follow up call.

## 2025-03-05 ENCOUNTER — Ambulatory Visit: Admitting: Adult Health
